# Patient Record
Sex: Female | Born: 1955 | Race: White | Hispanic: No | Marital: Married | Smoking: Former smoker
Health system: Southern US, Community
[De-identification: ages and names within clinical notes are randomized; demographics above are authoritative.]

## PROBLEM LIST (undated history)

## (undated) DIAGNOSIS — C55 Malignant neoplasm of uterus, part unspecified: Secondary | ICD-10-CM

## (undated) DIAGNOSIS — R32 Unspecified urinary incontinence: Secondary | ICD-10-CM

## (undated) DIAGNOSIS — Z1371 Encounter for nonprocreative screening for genetic disease carrier status: Secondary | ICD-10-CM

## (undated) DIAGNOSIS — R5383 Other fatigue: Secondary | ICD-10-CM

## (undated) DIAGNOSIS — I8390 Asymptomatic varicose veins of unspecified lower extremity: Secondary | ICD-10-CM

## (undated) DIAGNOSIS — C569 Malignant neoplasm of unspecified ovary: Secondary | ICD-10-CM

## (undated) DIAGNOSIS — C801 Malignant (primary) neoplasm, unspecified: Secondary | ICD-10-CM

## (undated) HISTORY — DX: Encounter for nonprocreative screening for genetic disease carrier status: Z13.71

---

## 1976-12-21 HISTORY — PX: OVARIAN CYST REMOVAL: SHX89

## 2004-11-26 ENCOUNTER — Ambulatory Visit: Payer: Self-pay

## 2005-11-24 ENCOUNTER — Ambulatory Visit: Payer: Self-pay

## 2006-11-24 ENCOUNTER — Ambulatory Visit: Payer: Self-pay

## 2007-11-30 ENCOUNTER — Ambulatory Visit: Payer: Self-pay

## 2008-10-18 ENCOUNTER — Ambulatory Visit: Payer: Self-pay | Admitting: Internal Medicine

## 2009-03-06 ENCOUNTER — Ambulatory Visit: Payer: Self-pay

## 2010-05-12 ENCOUNTER — Ambulatory Visit: Payer: Self-pay | Admitting: Family Medicine

## 2012-07-05 ENCOUNTER — Ambulatory Visit: Payer: Self-pay

## 2013-08-16 ENCOUNTER — Ambulatory Visit: Payer: Self-pay

## 2014-02-21 LAB — BASIC METABOLIC PANEL
BUN: 14 mg/dL (ref 4–21)
CREATININE: 0.8 mg/dL (ref <=1.1)

## 2014-02-21 LAB — LIPID PANEL
Cholesterol: 167 mg/dL (ref 0–200)
HDL: 61 mg/dL (ref 35–70)
LDL CALC: 87 mg/dL
Triglycerides: 95 mg/dL (ref 40–160)

## 2014-02-21 LAB — CBC AND DIFFERENTIAL: Hemoglobin: 13.9 g/dL (ref 12.0–16.0)

## 2014-04-16 ENCOUNTER — Ambulatory Visit: Payer: Self-pay | Admitting: Gastroenterology

## 2014-11-27 LAB — TSH: TSH: 1.7 u[IU]/mL (ref <=5.90)

## 2015-06-11 ENCOUNTER — Other Ambulatory Visit: Payer: Self-pay | Admitting: Internal Medicine

## 2015-06-11 DIAGNOSIS — Z1231 Encounter for screening mammogram for malignant neoplasm of breast: Secondary | ICD-10-CM

## 2015-07-02 ENCOUNTER — Ambulatory Visit
Admission: RE | Admit: 2015-07-02 | Discharge: 2015-07-02 | Disposition: A | Discharge status: Home / Self Care | Payer: No Typology Code available for payment source | Source: Ambulatory Visit | Attending: Internal Medicine | Admitting: Internal Medicine

## 2015-07-02 DIAGNOSIS — Z1231 Encounter for screening mammogram for malignant neoplasm of breast: Secondary | ICD-10-CM | POA: Insufficient documentation

## 2015-07-02 HISTORY — DX: Malignant (primary) neoplasm, unspecified: C80.1

## 2015-09-16 ENCOUNTER — Encounter: Payer: Self-pay | Admitting: Internal Medicine

## 2015-09-16 DIAGNOSIS — Z780 Asymptomatic menopausal state: Secondary | ICD-10-CM | POA: Insufficient documentation

## 2015-09-16 DIAGNOSIS — N393 Stress incontinence (female) (male): Secondary | ICD-10-CM | POA: Insufficient documentation

## 2015-09-16 DIAGNOSIS — R5383 Other fatigue: Secondary | ICD-10-CM | POA: Insufficient documentation

## 2015-09-16 DIAGNOSIS — I8390 Asymptomatic varicose veins of unspecified lower extremity: Secondary | ICD-10-CM | POA: Insufficient documentation

## 2015-09-24 ENCOUNTER — Ambulatory Visit (INDEPENDENT_AMBULATORY_CARE_PROVIDER_SITE_OTHER): Payer: No Typology Code available for payment source | Admitting: Internal Medicine

## 2015-09-24 ENCOUNTER — Encounter: Payer: Self-pay | Admitting: Internal Medicine

## 2015-09-24 VITALS — BP 132/72 | HR 84 | Ht 65.5 in | Wt 221.2 lb

## 2015-09-24 DIAGNOSIS — Z0000 Encounter for general adult medical examination without abnormal findings: Secondary | ICD-10-CM

## 2015-09-24 DIAGNOSIS — Z23 Encounter for immunization: Secondary | ICD-10-CM

## 2015-09-24 DIAGNOSIS — Z124 Encounter for screening for malignant neoplasm of cervix: Secondary | ICD-10-CM | POA: Diagnosis not present

## 2015-09-24 LAB — POCT URINALYSIS DIPSTICK
BILIRUBIN UA: NEGATIVE
Blood, UA: NEGATIVE
GLUCOSE UA: NEGATIVE
Ketones, UA: NEGATIVE
Leukocytes, UA: NEGATIVE
NITRITE UA: NEGATIVE
Protein, UA: NEGATIVE
Spec Grav, UA: 1.01
Urobilinogen, UA: 0.2
pH, UA: 5

## 2015-09-24 NOTE — Patient Instructions (Signed)

## 2015-09-24 NOTE — Progress Notes (Signed)
Date:  09/24/2015   Name:  Audrey Hart   DOB:  1956/06/07   MRN:  [id number]   Chief Complaint: Annual Exam Audrey Hart is a 59 y.o. female who presents today for her Complete Annual Exam. She feels well. She reports exercising regularly. She reports she is sleeping well. Mammogram was done recently. No breast complaints. Last Pap 2013 - with no pelvic complaints. Colonoscopy is up-to-date done 03/21/2014. She declines a flu vaccine today.  She does agree to a Tdap booster.  Review of Systems:  Review of Systems  Constitutional: Negative for fever, diaphoresis and fatigue.  HENT: Negative for hearing loss.   Eyes: Positive for visual disturbance.  Respiratory: Negative for chest tightness, shortness of breath and wheezing.   Cardiovascular: Negative for chest pain, palpitations and leg swelling.  Gastrointestinal: Negative for abdominal pain, constipation and blood in stool.  Endocrine: Negative for polyphagia and polyuria.  Genitourinary: Negative for urgency, hematuria, vaginal discharge and pelvic pain.  Musculoskeletal: Negative for myalgias, joint swelling and gait problem.  Skin: Negative for color change and rash.  Neurological: Negative for tremors, weakness and headaches.  Hematological: Negative for adenopathy.  Psychiatric/Behavioral: Negative for sleep disturbance and dysphoric mood.    Patient Active Problem List   Diagnosis Date Noted  . Menopause 09/16/2015  . Fatigue 09/16/2015  . Stress incontinence 09/16/2015  . 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 Oophorectomy  1980    Social History  Substance Use Topics  . Smoking status: Former Research scientist (life sciences)  . Smokeless tobacco: None  . Alcohol Use: 1.2 oz/week    2 Standard drinks or equivalent per week     Medication list has been reviewed and updated.  Physical Examination:  Physical Exam   Constitutional: She is oriented to person, place, and time. She appears well-developed and well-nourished. No distress.  HENT:  Head: Normocephalic and atraumatic.  Right Ear: Tympanic membrane and ear canal normal.  Left Ear: Tympanic membrane and ear canal normal.  Nose: Right sinus exhibits no maxillary sinus tenderness. Left sinus exhibits no maxillary sinus tenderness.  Mouth/Throat: Uvula is midline and oropharynx is clear and moist.  Eyes: Conjunctivae and EOM are normal. Right eye exhibits no discharge. Left eye exhibits no discharge. No scleral icterus.  Neck: Normal range of motion. Carotid bruit is not present. No erythema present. No thyromegaly present.  Cardiovascular: Normal rate, regular rhythm, normal heart sounds and normal pulses.   Pulmonary/Chest: Effort normal. No respiratory distress. She has no wheezes. Right breast exhibits no mass, no nipple discharge, no skin change and no tenderness. Left breast exhibits no mass, no nipple discharge, no skin change and no tenderness.  Abdominal: Soft. Bowel sounds are normal. There is no hepatosplenomegaly. There is no tenderness. There is no CVA tenderness.  Genitourinary: Vagina normal and uterus normal. There is no rash or lesion on the right labia. There is no rash or lesion on the left labia. Cervix exhibits no motion tenderness and no discharge. Right adnexum displays no mass, no tenderness and no fullness. Left adnexum displays no mass, no tenderness and no fullness. No vaginal discharge found.  Musculoskeletal: Normal range of motion.       Right knee: Normal.       Left knee: Normal.  Lymphadenopathy:    She has no cervical adenopathy.    She has  no axillary adenopathy.  Neurological: She is alert and oriented to person, place, and time. She has normal reflexes. No cranial nerve deficit or sensory deficit.  Skin: Skin is warm, dry and intact. No rash noted.  Psychiatric: She has a normal mood and affect. Her speech is  normal and behavior is normal. Thought content normal.  Nursing note and vitals reviewed.   BP 132/72 mmHg  Pulse 84  Ht 5' 5.5" (1.664 m)  Wt 221 lb 3.2 oz (100.336 kg)  BMI 36.24 kg/m2  Assessment and Plan: 1. Annual physical exam Recent mammogram was normal Colonoscopy is up-to-date Patient declines flu vaccine - POCT urinalysis dipstick - CBC with Differential/Platelet - Comprehensive metabolic panel - TSH  2. Need for tetanus booster - Tdap vaccine greater than or equal to 7yo IM  3. Cervical cancer screening - Pap IG and HPV (high risk) DNA detection Recommend Pap smear every 3-5 years   Audrey Maidens, MD [facility]  09/24/2015

## 2015-09-25 LAB — CBC WITH DIFFERENTIAL/PLATELET
BASOS: 1 %
Basophils Absolute: 0 10*3/uL (ref 0.0–0.2)
EOS (ABSOLUTE): 0.2 10*3/uL (ref 0.0–0.4)
EOS: 2 %
HEMATOCRIT: 42.3 % (ref 34.0–46.6)
Hemoglobin: 14.3 g/dL (ref 11.1–15.9)
Immature Grans (Abs): 0 10*3/uL (ref 0.0–0.1)
Immature Granulocytes: 1 %
LYMPHS ABS: 2.8 10*3/uL (ref 0.7–3.1)
Lymphs: 42 %
MCH: 30.4 pg (ref 26.6–33.0)
MCHC: 33.8 g/dL (ref 31.5–35.7)
MCV: 90 fL (ref 79–97)
MONOS ABS: 0.5 10*3/uL (ref 0.1–0.9)
Monocytes: 8 %
NEUTROS ABS: 3.1 10*3/uL (ref 1.4–7.0)
NEUTROS PCT: 46 %
PLATELETS: 341 10*3/uL (ref 150–379)
RBC: 4.7 x10E6/uL (ref 3.77–5.28)
RDW: 13.8 % (ref 12.3–15.4)
WBC: 6.6 10*3/uL (ref 3.4–10.8)

## 2015-09-25 LAB — COMPREHENSIVE METABOLIC PANEL
ALT: 24 IU/L (ref 0–32)
AST: 18 IU/L (ref 0–40)
Albumin/Globulin Ratio: 1.6 (ref 1.1–2.5)
Albumin: 4.1 g/dL (ref 3.5–5.5)
Alkaline Phosphatase: 86 IU/L (ref 39–117)
BILIRUBIN TOTAL: 0.7 mg/dL (ref 0.0–1.2)
BUN/Creatinine Ratio: 15 (ref 9–23)
BUN: 13 mg/dL (ref 6–24)
CO2: 28 mmol/L (ref 18–29)
Calcium: 9.4 mg/dL (ref 8.7–10.2)
Chloride: 98 mmol/L (ref 97–108)
Creatinine, Ser: 0.84 mg/dL (ref 0.57–1.00)
GFR calc Af Amer: 88 mL/min/{1.73_m2} (ref >59)
GFR calc non Af Amer: 76 mL/min/{1.73_m2} (ref >59)
Globulin, Total: 2.5 g/dL (ref 1.5–4.5)
Glucose: 87 mg/dL (ref 65–99)
POTASSIUM: 4.7 mmol/L (ref 3.5–5.2)
Sodium: 142 mmol/L (ref 134–144)
Total Protein: 6.6 g/dL (ref 6.0–8.5)

## 2015-09-25 LAB — TSH: TSH: 1.6 u[IU]/mL (ref 0.450–4.500)

## 2015-09-27 LAB — PAP IG AND HPV HIGH-RISK: PAP Smear Comment: 0

## 2016-03-26 ENCOUNTER — Ambulatory Visit (INDEPENDENT_AMBULATORY_CARE_PROVIDER_SITE_OTHER): Payer: BLUE CROSS/BLUE SHIELD | Admitting: Internal Medicine

## 2016-03-26 ENCOUNTER — Encounter: Payer: Self-pay | Admitting: Internal Medicine

## 2016-03-26 VITALS — BP 148/86 | HR 100 | Temp 99.6°F | Ht 65.5 in | Wt 228.0 lb

## 2016-03-26 DIAGNOSIS — J111 Influenza due to unidentified influenza virus with other respiratory manifestations: Secondary | ICD-10-CM

## 2016-03-26 MED ORDER — OSELTAMIVIR PHOSPHATE 75 MG PO CAPS: 75.0000 mg | ORAL_CAPSULE | Freq: Two times a day (BID) | ORAL | Status: DC

## 2016-03-26 MED ORDER — GUAIFENESIN-CODEINE 100-10 MG/5ML PO SYRP: 5.0000 mL | ORAL_SOLUTION | Freq: Three times a day (TID) | ORAL | Status: DC | PRN

## 2016-03-26 NOTE — Progress Notes (Signed)
    Date:  03/26/2016   Name:  Audrey Hart   DOB:  02/12/1956   MRN:  [id number]   Chief Complaint: Fever Fever  This is a new problem. The current episode started yesterday. The problem occurs constantly. The problem has been unchanged. The maximum temperature noted was 100 to 100.9 F. The temperature was taken using an oral thermometer. Associated symptoms include chest pain, congestion, coughing, headaches and muscle aches. Pertinent negatives include no diarrhea, nausea, vomiting or wheezing. She has tried nothing for the symptoms.      Review of Systems  Constitutional: Positive for fever. Negative for chills.  HENT: Positive for congestion.   Respiratory: Positive for cough. Negative for chest tightness and wheezing.   Cardiovascular: Positive for chest pain.  Gastrointestinal: Negative for nausea, vomiting and diarrhea.  Musculoskeletal: Positive for neck pain.  Neurological: Positive for light-headedness and headaches. Negative for dizziness and syncope.    Patient Active Problem List   Diagnosis Date Noted  . Menopause 09/16/2015  . Fatigue 09/16/2015  . Stress incontinence 09/16/2015  . 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 Oophorectomy  1980    Social History  Substance Use Topics  . Smoking status: Former Research scientist (life sciences)  . Smokeless tobacco: None  . Alcohol Use: 1.2 oz/week    2 Standard drinks or equivalent per week     Comment: occasional     Medication list has been reviewed and updated.   Physical Exam  Constitutional: She appears well-developed and well-nourished. She appears listless. She has a sickly appearance.  Neck: Normal range of motion. Neck supple. Muscular tenderness present.  Cardiovascular: Normal rate, regular rhythm and normal heart sounds.   Pulmonary/Chest: Effort normal and breath sounds normal. No respiratory distress. She  has no wheezes.  Lymphadenopathy:    She has no cervical adenopathy.  Neurological: She appears listless.  Psychiatric: She has a normal mood and affect.  Nursing note and vitals reviewed.   BP 148/86 mmHg  Pulse 100  Temp(Src) 99.6 F (37.6 C) (Oral)  Ht 5' 5.5" (1.664 m)  Wt 228 lb (103.42 kg)  BMI 37.35 kg/m2  SpO2 93%  Assessment and Plan: 1. Influenza Push fluids; tylenol every 6-8 hours - oseltamivir (TAMIFLU) 75 MG capsule; Take 1 capsule (75 mg total) by mouth 2 (two) times daily.  Dispense: 10 capsule; Refill: 0 - guaiFENesin-codeine (ROBITUSSIN AC) 100-10 MG/5ML syrup; Take 5 mLs by mouth 3 (three) times daily as needed for cough.  Dispense: 473 mL; Refill: 0   Audrey Maidens, MD [facility]  03/26/2016

## 2016-03-26 NOTE — Patient Instructions (Signed)

## 2016-09-29 ENCOUNTER — Encounter: Payer: No Typology Code available for payment source | Admitting: Internal Medicine

## 2016-11-20 DIAGNOSIS — C55 Malignant neoplasm of uterus, part unspecified: Secondary | ICD-10-CM

## 2016-11-20 HISTORY — DX: Malignant neoplasm of uterus, part unspecified: C55

## 2016-11-30 ENCOUNTER — Ambulatory Visit
Admission: EM | Admit: 2016-11-30 | Discharge: 2016-11-30 | Disposition: A | Discharge status: Home / Self Care | Payer: BLUE CROSS/BLUE SHIELD | Attending: Family Medicine | Admitting: Family Medicine

## 2016-11-30 DIAGNOSIS — N950 Postmenopausal bleeding: Secondary | ICD-10-CM

## 2016-11-30 LAB — URINALYSIS, COMPLETE (UACMP) WITH MICROSCOPIC
BILIRUBIN URINE: NEGATIVE
Bacteria, UA: NONE SEEN
GLUCOSE, UA: NEGATIVE mg/dL
LEUKOCYTES UA: NEGATIVE
NITRITE: NEGATIVE
PH: 6 (ref 5.0–8.0)
SPECIFIC GRAVITY, URINE: 1.025 (ref 1.005–1.030)
SQUAMOUS EPITHELIAL / LPF: NONE SEEN
WBC, UA: NONE SEEN WBC/hpf (ref 0–5)

## 2016-11-30 LAB — CBC WITH DIFFERENTIAL/PLATELET
Basophils Absolute: 0.1 10*3/uL (ref 0–0.1)
Basophils Relative: 1 %
EOS ABS: 0.1 10*3/uL (ref 0–0.7)
Eosinophils Relative: 1 %
HEMATOCRIT: 42.9 % (ref 35.0–47.0)
HEMOGLOBIN: 14.4 g/dL (ref 12.0–16.0)
LYMPHS ABS: 2.6 10*3/uL (ref 1.0–3.6)
Lymphocytes Relative: 35 %
MCH: 30.5 pg (ref 26.0–34.0)
MCHC: 33.4 g/dL (ref 32.0–36.0)
MCV: 91.3 fL (ref 80.0–100.0)
Monocytes Absolute: 0.5 10*3/uL (ref 0.2–0.9)
Monocytes Relative: 6 %
NEUTROS PCT: 57 %
Neutro Abs: 4.2 10*3/uL (ref 1.4–6.5)
Platelets: 303 10*3/uL (ref 150–440)
RBC: 4.7 MIL/uL (ref 3.80–5.20)
RDW: 14.2 % (ref 11.5–14.5)
WBC: 7.4 10*3/uL (ref 3.6–11.0)

## 2016-11-30 LAB — BASIC METABOLIC PANEL
Anion gap: 5 (ref 5–15)
BUN: 15 mg/dL (ref 6–20)
CHLORIDE: 103 mmol/L (ref 101–111)
CO2: 30 mmol/L (ref 22–32)
CREATININE: 0.83 mg/dL (ref 0.44–1.00)
Calcium: 8.6 mg/dL — ABNORMAL LOW (ref 8.9–10.3)
GFR calc Af Amer: >60 mL/min (ref >60)
GFR calc non Af Amer: >60 mL/min (ref >60)
Glucose, Bld: 104 mg/dL — ABNORMAL HIGH (ref 65–99)
Potassium: 3.6 mmol/L (ref 3.5–5.1)
SODIUM: 138 mmol/L (ref 135–145)

## 2016-11-30 LAB — WET PREP, GENITAL
Clue Cells Wet Prep HPF POC: NONE SEEN
SPERM: NONE SEEN
Trich, Wet Prep: NONE SEEN
Yeast Wet Prep HPF POC: NONE SEEN

## 2016-11-30 NOTE — ED Notes (Signed)
Appt made with Dr Ouida Sills tomorrow at 3:00pm

## 2016-11-30 NOTE — Discharge Instructions (Signed)
Rest. Drink plenty of fluids.   Follow up closely with OBGYN.  Follow up with your primary care physician this week as needed. Return to Urgent care for new or worsening concerns.

## 2016-11-30 NOTE — ED Provider Notes (Signed)
[facility] ____________________________________________  Time seen: Approximately 6:15 PM  I have reviewed the triage vital signs and the nursing notes.   HISTORY  Chief Complaint Vaginal Bleeding   HPI Audrey Hart is a 60 y.o. female presents for evaluation of vaginal bleeding. Patient reports that this past Friday and Saturday she had vaginal bleeding, described as dark red blood that saturated 3 panty liners each day. Patient reports that she had a similar more brief episode 1 month ago. Patient reports that she is 15 years postmenopausal. Patient denies any other vaginal bleeding. Denies any vaginal bleeding today. Patient states some suprapubic pressure sensation, but denies pain. Denies urinary frequency, urinary urgency or burning with urination. Denies other abdominal pain or back pain. Denies vaginal discharge, vaginal itching or vaginal irritation. Patient reports sexually active with her one partner husband, denies recent sexual partner changes. Last sexual intercourse for 2 weeks ago. Patient denies any direct correlation with vaginal bleeding presentation and timing of sexual intercourse. Denies any trauma. Patient reports that she did fall one day last week because she tripped but states she was able to catch herself on her knees and her hands and denies any abdominal or pelvic trauma.   Patient denies any history of hormonal use. Denies any other vaginal complaints. Denies smoking. Denies blood in urine. Denies melena, hematochezia, abnormal bruising or abnormal bleeding. Denies family history of breast uterine or ovarian cancer. Patient reports she has had one ovarian cyst in the past that was removed. Denies any other abdominal surgery. Denies fevers, chest pain, shortness of breath. Reports continues to eat and drink well with normal activity levels. Patient states unable to be evaluated by a new patient OB/GYN until the end of January.   Past Medical History:   Diagnosis Date  . Cancer [facility])    skin ca    Patient Active Problem List   Diagnosis Date Noted  . Menopause 09/16/2015  . Fatigue 09/16/2015  . Stress incontinence 09/16/2015  . 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 OOPHORECTOMY  1980      No current facility-administered medications for this encounter.  No current outpatient prescriptions on file.  Allergies Shrimp [shellfish allergy]   family history. Denies family history of cancer.  Brothers: CAD, MI Mother: "liver issues"  Social History Social History  Substance Use Topics  . Smoking status: Former Research scientist (life sciences)  . Smokeless tobacco: Never Used  . Alcohol use 1.2 oz/week    2 Standard drinks or equivalent per week     Comment: occasional    Review of Systems Constitutional: No fever/chills Eyes: No visual changes. ENT: No sore throat. Cardiovascular: Denies chest pain. Respiratory: Denies shortness of breath. Gastrointestinal: No abdominal pain.  No nausea, no vomiting.  No diarrhea.  No constipation. Genitourinary: Negative for dysuria. As above.  Musculoskeletal: Negative for back pain. Skin: Negative for rash. Neurological: Negative for headaches, focal weakness or numbness.  10-point ROS otherwise negative.  ____________________________________________   PHYSICAL EXAM:  VITAL SIGNS: ED Triage Vitals  Enc Vitals Group     BP 11/30/16 1411 140/66     Pulse Rate 11/30/16 1411 78     Resp 11/30/16 1411 18     Temp 11/30/16 1411 98 F (36.7 C)     Temp Source 11/30/16 1411 Oral     SpO2 11/30/16 1411 98 %     Weight 11/30/16 1409 220 lb (99.8 kg)     Height  11/30/16 1409 5\' 5"  (1.651 m)     Head Circumference --      Peak Flow --      Pain Score 11/30/16 1644 0     Pain Loc --      Pain Edu? --      Excl. in [place]? --     Constitutional: Alert and oriented. Well appearing and in no acute distress. Eyes: Conjunctivae are normal. PERRL. EOMI. ENT       Head: Normocephalic and atraumatic.      Mouth/Throat: Mucous membranes are moist. Cardiovascular: Normal rate, regular rhythm. Grossly normal heart sounds.  Good peripheral circulation. Respiratory: Normal respiratory effort without tachypnea nor retractions. Breath sounds are clear and equal bilaterally. No wheezes/rales/rhonchi.. Gastrointestinal: Soft and nontender. Obese abdomen. Normal Bowel sounds. No CVA tenderness. Pelvic: Exam completed with Lauri RN at bedside as chaperone. External: Normal appearance no rash or lesions. Speculum: No active bleeding, no dried blood, no foreign body, no canal laceration or irritation visualized, mild irritation noted at central cervix. Bimanual: nontender. No cervical or adnexal tenderness. Musculoskeletal:  Ambulatory with steady gait. No midline cervical, thoracic or lumbar tenderness to palpation.  Neurologic:  Normal speech and language. No gross focal neurologic deficits are appreciated. Speech is normal. No gait instability.  Skin:  Skin is warm, dry and intact. No rash noted. Psychiatric: Mood and affect are normal. Speech and behavior are normal. Patient exhibits appropriate insight and judgment   ___________________________________________   LABS (all labs ordered are listed, but only abnormal results are displayed)  Labs Reviewed  WET PREP, GENITAL - Abnormal; Notable for the following:       Result Value   WBC, Wet Prep HPF POC FEW (*)    All other components within normal limits  URINALYSIS, COMPLETE (UACMP) WITH MICROSCOPIC - Abnormal; Notable for the following:    Hgb urine dipstick SMALL (*)    Ketones, ur TRACE (*)    Protein, ur TRACE (*)    All other components within normal limits  BASIC METABOLIC PANEL - Abnormal; Notable for the following:    Glucose, Bld 104 (*)    Calcium 8.6 (*)    All other components within normal limits  CBC WITH DIFFERENTIAL/PLATELET     PROCEDURES Procedures   INITIAL IMPRESSION /  ASSESSMENT AND PLAN / ED COURSE  Pertinent labs & imaging results that were available during my care of the patient were reviewed by me and considered in my medical decision making (see chart for details).  Very well-appearing patient. No acute distress. 15 years postmenopausal vaginal bleeding. No active vaginal bleeding at this time. Patient hemodynamically stable. Discussed in detail with patient need for further evaluation for diagnosis as well as as it exclusion of endometrial cancer. Discussed in detail with patient need to follow-up with OB/GYN. RN called and was able to obtain appointment with Dr. Ouida Sills for tomorrow at 3 PM. Patient agrees with this plan.   Discussed follow up with Primary care physician this week. Discussed follow up and return parameters including no resolution or any worsening concerns. Patient verbalized understanding and agreed to plan.   ____________________________________________   FINAL CLINICAL IMPRESSION(S) / ED DIAGNOSES  Final diagnoses:  Postmenopausal vaginal bleeding     There are no discharge medications for this patient.   Note: This dictation was prepared with Dragon dictation along with smaller phrase technology. Any transcriptional errors that result from this process are unintentional.    Clinical Course  Marylene Land, NP 11/30/16 [place], NP 11/30/16 1827

## 2016-11-30 NOTE — ED Triage Notes (Signed)
Pt is postmenopausal for 15 years and about 4 months ago she had a little spotting, and then Friday and Saturday she bleed to were she saturated a pad each day. She says something just feels werid. The blood is dark in color and no smell. She hasnt had intercourse lately. She did have a fall Tuesday however she isnt sure if that's related or not. She fell in our parking lot.

## 2016-12-21 DIAGNOSIS — C569 Malignant neoplasm of unspecified ovary: Secondary | ICD-10-CM

## 2016-12-21 HISTORY — DX: Malignant neoplasm of unspecified ovary: C56.9

## 2016-12-30 ENCOUNTER — Encounter (INDEPENDENT_AMBULATORY_CARE_PROVIDER_SITE_OTHER): Payer: Self-pay

## 2016-12-30 ENCOUNTER — Inpatient Hospital Stay: Payer: BLUE CROSS/BLUE SHIELD | Attending: Obstetrics and Gynecology | Admitting: Obstetrics and Gynecology

## 2016-12-30 VITALS — BP 148/88 | HR 73 | Temp 97.7°F | Wt 228.6 lb

## 2016-12-30 DIAGNOSIS — E669 Obesity, unspecified: Secondary | ICD-10-CM | POA: Diagnosis not present

## 2016-12-30 DIAGNOSIS — Z780 Asymptomatic menopausal state: Secondary | ICD-10-CM | POA: Diagnosis not present

## 2016-12-30 DIAGNOSIS — C541 Malignant neoplasm of endometrium: Secondary | ICD-10-CM | POA: Insufficient documentation

## 2016-12-30 DIAGNOSIS — N393 Stress incontinence (female) (male): Secondary | ICD-10-CM

## 2016-12-30 DIAGNOSIS — Z87891 Personal history of nicotine dependence: Secondary | ICD-10-CM

## 2016-12-30 DIAGNOSIS — Z6838 Body mass index (BMI) 38.0-38.9, adult: Secondary | ICD-10-CM | POA: Diagnosis not present

## 2016-12-30 NOTE — Patient Instructions (Addendum)
Laparoscopic Hysterectomy, Care After Refer to this sheet in the next few weeks. These instructions provide you with information on caring for yourself after your procedure. Your health care provider may also give you more specific instructions. Your treatment has been planned according to current medical practices, but problems sometimes occur. Call your health care provider if you have any problems or questions after your procedure. What can I expect after the procedure?  Pain and bruising at the incision sites. You will be given pain medicine to control it.  Menopausal symptoms such as hot flashes, night sweats, and insomnia if your ovaries were removed.  Sore throat from the breathing tube that was inserted during surgery. Follow these instructions at home:  Only take over-the-counter or prescription medicines for pain, discomfort, or fever as directed by your health care provider.  Do not take aspirin. It can cause bleeding.  Do not drive when taking pain medicine.  Follow your health care provider's advice regarding diet, exercise, lifting, driving, and general activities.  Resume your usual diet as directed and allowed.  Get plenty of rest and sleep.  Do not douche, use tampons, or have sexual intercourse for at least 6 weeks, or until your health care provider gives you permission.  Change your bandages (dressings) as directed by your health care provider.  Monitor your temperature and notify your health care provider of a fever.  Take showers instead of baths for 2-3 weeks.  Do not drink alcohol until your health care provider gives you permission.  If you develop constipation, you may take a mild laxative with your health care provider's permission. Bran foods may help with constipation problems. Drinking enough fluids to keep your urine clear or pale yellow may help as well.  Try to have someone home with you for 1-2 weeks to help around the house.  Keep all of your  follow-up appointments as directed by your health care provider. Contact a health care provider if:  You have swelling, redness, or increasing pain around your incision sites.  You have pus coming from your incision.  You notice a bad smell coming from your incision.  Your incision breaks open.  You feel dizzy or lightheaded.  You have pain or bleeding when you urinate.  You have persistent diarrhea.  You have persistent nausea and vomiting.  You have abnormal vaginal discharge.  You have a rash.  You have any type of abnormal reaction or develop an allergy to your medicine.  You have poor pain control with your prescribed medicine. Get help right away if:  You have chest pain or shortness of breath.  You have severe abdominal pain that is not relieved with pain medicine.  You have pain or swelling in your legs. This information is not intended to replace advice given to you by your health care provider. Make sure you discuss any questions you have with your health care provider. Document Released: 09/27/2013 Document Revised: 05/14/2016 Document Reviewed: 06/27/2013 Elsevier Interactive Patient Education  2017 [employer]       Clear Liquid Diet for GYN Oncology Patients Day Before Surgery The day before your scheduled surgery DO NOT EAT any solid foods.  We do want you to drink enough liquids, but NO MILK products.  We do not want you to be dehydrated.  Clear liquids are defined as no milk products and no pieces of any solid food. The following are all approved for you to drink the day before you surgery.  Chicken, Beef   or Vegetable Broth (bouillon or consomm) - NO BROTH AFTER MIDNIGHT  Plain Jello  (no fruit)  Water  Strained lemonade or fruit punch  Gatorade (any flavor)  CLEAR Ensure or Boost Breeze  Fruit juices without pulp, such as apple, grape, or cranberry juice  Clear sodas - NO SODA AFTER MIDNIGHT  Ice Pops without bits of fruit or fruit  pulp  Honey  Tea or coffee without milk or cream Any foods not on the above list should be avoided.                                                                               DIVISION OF GYNECOLOGIC ONCOLOGY BOWEL PREP   The following instructions are extremely important to prepare for your surgery. Please follow them carefully   Step 1: Liquid Diet Instructions   The day before surgery, drink ONLY CLEAR LIQUIDS for breakfast, lunch, dinner and throughout the day.  Drink at least 64 oz of fluid.             CLEAR LIQUID EXAMPLES:             Beef, chicken or vegetable broth, sodas, coffee, tea (sugar, lemon             artificial sweeteners, honey are acceptable), juices (apple, grape, cranberry, any    mixture of clear juices). Kool-Aid, Gatorade, Jell-o (without fruit), popsicles                          NO MILK, MILK PRODUCTS, NON-DAIRY CREAMERS    Step 2: Laxatives           The evening before surgery:   Time: around 5pm   Follow these instructions carefully.   Administer 1 Dulcolax suppository according to manufacturer instructions on the box. You will need to purchase this laxative at a pharmacy or grocery store.     Individual responses to laxatives vary; this prep may cause multiple bowel movements. It often works in 30 minutes and may take as long as 3 hours. Stay near an available bathroom.    It is important to stay hydrated. Ensure you are still drinking clear liquids.      IMPORTANT: FOR YOUR SAFETY, WE WILL HAVE TO CANCEL YOUR SURGERY IF YOU DO NOT FOLLOW THESE INSTRUCTIONS.    Do not eat anything after midnight (including gum or candy) prior to your surgery.  Avoid drinking carbonated beverages after midnight.  You can have clear liquids up until one hour before you arrive at the hospital. "Nothing by mouth" means no liquids, gum, candy, etc for one hour before your arrival time.                                              Bowel Symptoms After  Surgery After gynecologic surgery, women often have temporary changes in bowel function (constipation and gas pain).  Following are tips to help prevent and treat common bowel problems.  It also tells you when to call the doctor.  This is   important because some symptoms might be a sign of a more serious bowel problem such as obstruction (bowel blockage).  These problems are rare but can happen after gynecologic surgery.   Besides surgery, what can temporarily affect bowel function? 1. Dietary changes   2. Decreased physical activity   3.Antibiotics   4. Pain medication   How can I prevent constipation (three days or more without a stool)? 1. Include fiber in your diet: whole grains, raw or dried fruits & vegetables, prunes, prune/pear juiceDrink at least 8 glasses of liquid (preferably water) every day 2. Avoid: ? Gas forming foods such as broccoli, beans, peas, salads, cabbage, sweet potatoes ? Greasy, fatty, or fried foods 3. Activity helps bowel function return to normal, walk around the house at least 3-4 times each day for 15 minutes or longer, if tolerated.  Rocking in a rocking chair is preferable to sitting still. 4. Stool softeners: these are not laxatives, but serve to soften the stool to avoid straining.  Take 2-4 times a day until normal bowel function returns         Examples: Colace or generic equivalent (Docusafe) 5. Bulk laxatives: provide a concentrated source of fiber.  They do not stimulate the bowel.  Take 1-2 times each day until normal bowel function return.              Examples: Citrucel, Metamucil, Fiberal, Fibercon   What can I take for "Gas Pains"? 1. Simethicone (Mylicon, Gas-X, Maalox-Gas, Mylanta-Gas) take 3-4 times a day 2. Maalox Regular - take 3-4 times a day 3. Mylanta Regular - take 3-4 times a day   What can I take if I become constipated? 1. Start with stool softeners and add additional laxatives below as needed to have a bowel movement every 1-2 days   2. Stool softeners 1-2 tablets, 2 times a day 3. Senakot 1-2 tablets, 1-2 times a day 4. Glycerin suppository can soften hard stool take once a day 5. Bisacodyl suppository once a day  6. Milk of Magnesia 30 mL 1-2 times a day 7. Fleets or tap water enema    What can I do for nausea?  1. Limit most solid foods for 24-48 hours 2. Continue eating small frequent amounts of liquids and/or bland soft foods ? Toast, crackers, cooked cereal (grits, cream of wheat, rice) 3. Benadryl: a mild anti-nausea medicine can be obtained without a prescription. May cause drowsiness, especially if taken with narcotic pain medicines 4. Contact provider for prescription nausea medication     What can I do, or take for diarrhea (more than five loose stools per day)? 1. Drink plenty of clear fluids to prevent dehydration 2. May take Kaopectate, Pepto-Bismol, Immodium, or probiotics for 1-2 days 3. Annusol or Preparation-H can be helpful for hemorrhoids and irritated tissue around anus   When should I call the doctor?             CONSTIPATION:   Not relieved after three days following the above program VOMITING:  That contains blood, "coffee ground" material  More the three times/hour and unable to keep down nausea medication for more than eight hours  With dry mouth, dark or strong urine, feeling light-headed, dizzy, or confused  With severe abdominal pain or bloating for more than 24 hours DIARRHEA:  That continues for more then 24-48 hours despite treatment  That contains blood or tarry material  With dry mouth, dark or strong urine, feeling light~headed, dizzy, or confused FEVER:  101   F or higher along with nausea, vomiting, gas pain, diarrhea UNABLE TO:  Pass gas from rectum for more than 24 hours  Tolerate liquids by mouth for more than 24 hours              Laparoscopy Laparoscopy is a procedure to diagnose diseases in the abdomen. During the procedure, a thin, lighted,  pencil-sized instrument called a laparoscope is inserted into the abdomen through an incision. The laparoscope allows your health care provider to look at the organs inside your body. LET YOUR HEALTH CARE PROVIDER KNOW ABOUT:  Any allergies you have.  All medicines you are taking, including vitamins, herbs, eye drops, creams, and over-the-counter medicines.  Previous problems you or members of your family have had with the use of anesthetics.  Any blood disorders you have.  Previous surgeries you have had.  Medical conditions you have. RISKS AND COMPLICATIONS  Generally, this is a safe procedure. However, problems can occur, which may include:  Infection.  Bleeding.  Damage to other organs.  Allergic reaction to the anesthetics used during the procedure. BEFORE THE PROCEDURE  Do not eat or drink anything after midnight on the night before the procedure or as directed by your health care provider.  Ask your health care provider about: ? Changing or stopping your regular medicines. ? Taking medicines such as aspirin and ibuprofen. These medicines can thin your blood. Do not take these medicines before your procedure if your health care provider instructs you not to.  Plan to have someone take you home after the procedure. PROCEDURE  You may be given a medicine to help you relax (sedative).  You will be given a medicine to make you sleep (general anesthetic).  Your abdomen will be inflated with a gas. This will make your organs easier to see.  Small incisions will be made in your abdomen.  A laparoscope and other small instruments will be inserted into the abdomen through the incisions.  A tissue sample may be removed from an organ in the abdomen for examination.  The instruments will be removed from the abdomen.  The gas will be released.  The incisions will be closed with stitches (sutures). AFTER THE PROCEDURE  Your blood pressure, heart rate, breathing rate, and  blood oxygen level will be monitored often until the medicines you were given have worn off.   This information is not intended to replace advice given to you by your health care provider. Make sure you discuss any questions you have with your health care provider.   c.    Laparoscopy, Care After Refer to this sheet in the next few weeks. These instructions provide you with information about caring for yourself after your procedure. Your health care provider may also give you more specific instructions. Your treatment has been planned according to current medical practices, but problems sometimes occur. Call your health care provider if you have any problems or questions after your procedure. WHAT TO EXPECT AFTER THE PROCEDURE After your procedure, it is common to have mild discomfort in the throat and abdomen. HOME CARE INSTRUCTIONS  Take over-the-counter and prescription medicines only as told by your health care provider.  Do not drive for 24 hours if you received a sedative.  Return to your normal activities as told by your health care provider.  Do not take baths, swim, or use a hot tub until your health care provider approves. You may shower.  Follow instructions from your health care provider about how   to take care of your incision. Make sure you: ? Wash your hands with soap and water before you change your bandage (dressing). If soap and water are not available, use hand sanitizer. ? Change your dressing as told by your health care provider. ? Leave stitches (sutures), skin glue, or adhesive strips in place. These skin closures may need to stay in place for 2 weeks or longer. If adhesive strip edges start to loosen and curl up, you may trim the loose edges. Do not remove adhesive strips completely unless your health care provider tells you to do that.  Check your incision area every day for signs of infection. Check for: ? More redness, swelling, or pain. ? More fluid or  blood. ? Warmth. ? Pus or a bad smell.  It is your responsibility to get the results of your procedure. Ask your health care provider or the department performing the procedure when your results will be ready. SEEK MEDICAL CARE IF:  There is new pain in your shoulders.  You feel light-headed or faint.  You are unable to pass gas or unable to have a bowel movement.  You feel nauseous or you vomit.  You develop a rash.  You have more redness, swelling, or pain around your incision.  You have more fluid or blood coming from your incision.  Your incision feels warm to the touch.  You have pus or a bad smell coming from your incision.  You have a fever or chills. SEEK IMMEDIATE MEDICAL CARE IF:  Your pain is getting worse.  You have ongoing vomiting.  The edges of your incision open up.  You have trouble breathing.  You have chest pain.   This information is not intended to replace advice given to you by your health care provider. Make sure you discuss any questions you have with your health care provider.                   Uterine Cancer Uterine cancer is an abnormal growth of tissue (tumor) in the uterus that is cancerous (malignant). Unlike noncancerous (benign) tumors, malignant tumors can spread to other parts of your body. The wall of the uterus has two layers of tissue. The inner layer is the endometrium. The outer layer of muscle tissue is the myometrium. The most common type of uterine cancer begins in the endometrium. This is called endometrial cancer. Cancer that begins in the myometrium is called uterine sarcoma, which is very rare. What increases the risk? Although the exact cause of uterine cancer is unknown, there are a number of risk factors that can increase your chances of getting uterine cancer. They include:  Your age. Uterine cancer occurs mostly in women older than 50 years.  Having an enlarged endometrium (endometrial hyperplasia).  Using  hormone therapy.  Obesity.  Taking the drug tamoxifen.  White race.  Infertility.  Never being pregnant.  Beginning menstrual periods at an age younger than 12 years.  Having menstrual periods at an age older than 42 years.  Personal history of ovarian, intestinal, or colorectal cancer.  Having a family history of uterine cancer.  Having a family history of hereditary nonpolyposis colon cancer (HNPCC).  Having diabetes, high blood pressure, thyroid disease, or gallbladder disease.  Long-term use of high-dose birth control pills.  Exposure to radiation.  Smoking. What are the signs or symptoms?  Abnormal vaginal bleeding or discharge. Bleeding may start as a watery, blood-streaked flow that gradually contains more blood.  Any  vaginal bleeding after menopause.  Difficult or painful urination.  Pain during intercourse.  Pain in the pelvic area.  Mass in the vagina.  Pain or fullness in the abdomen.  Frequent urination.  Bleeding between periods.  Growth of the stomach.  Unexplained weight loss. Uterine cancer usually occurs after menopause. However, it may also occur around the time that menopause begins. Abnormal vaginal bleeding is the most common symptom of uterine cancer. Women should not assume that abnormal vaginal bleeding is part of menopause. How is this diagnosed? Your health care provider will ask about your medical history. He or she may also perform a number of procedures, such as:  A physical and pelvic exam. Your health care provider will feel your pelvis for any lumps.  Blood and urine tests.  X-rays.  Imaging tests, such as CT scans, ultrasonography, or MRIs.  A hysteroscopy to view the inside of your uterus.  A Pap test to sample cells from the cervix and upper vagina to check for abnormal cells.  Taking a tissue sample (biopsy) from the uterine lining to look for cancer cells.  A dilation and curettage (D&C). This involves  stretching (dilation) the cervix and scraping (curettage) the inside lining of the uterus to get a tissue sample. The sample is examined under a microscope to look for cancer cells. Your cancer will be staged to determine its severity and extent. Staging is a careful attempt to find out the size of the tumor, whether the cancer has spread, and if so, to what parts of the body. You may need to have more tests to determine the stage of your cancer. The test results will help determine what treatment plan is best for you. Cancer stages include:  Stage I. The cancer is only found in the uterus.  Stage II. The cancer has spread to the cervix.  Stage III. The cancer has spread outside the uterus, but not outside the pelvis. The cancer may have spread to the lymph nodes in the pelvis.  Stage IV. The cancer has spread to other parts of the body, such as the bladder or rectum. How is this treated? Most women with uterine cancer are treated with surgery. This includes removing the uterus, cervix, fallopian tubes, and ovaries (total hysterectomy). Your lymph nodes near the tumor may also be removed. Some women have radiation, chemotherapy, or hormonal therapy. Other women have a combination of these therapies. Follow these instructions at home:  Take medicines only as directed by your health care provider.  Maintain a healthy diet.  Exercise regularly.  If you have diabetes, high blood pressure, thyroid disease, or gallbladder disease, follow your health care provider's instructions to keep it under control.  Do not smoke.  Consider joining a support group. This may help you learn to cope with the stress of having uterine cancer.  Seek advice to help you manage treatment side effects.  Keep all follow-up visits as directed by your health care provider. Contact a health care provider if:  You have increased stomach or pelvic pain.  You cannot urinate.  You have abnormal bleeding. This  information is not intended to replace advice given to you by your health care provider. Make sure you discuss any questions you have with your health care provider. Document Released: 12/07/2005 Document Revised: 05/14/2016 Document Reviewed: 05/26/2013 Elsevier Interactive Patient Education  2017 [employer].

## 2016-12-30 NOTE — Progress Notes (Signed)
Gynecologic Oncology Consult Visit   Referring Provider: Dr. Ouida Sills  Chief Concern: grade 1 endometrial cancer  Subjective:  Audrey Hart is a 61 y.o. female s/p oophorectomy for ovarian cysts in the 1978 who is seen in consultation from Dr. Ouida Sills for grade 1 endometrial cancer. She was referred to Dr. Ouida Sills by Paula Compton, NP for  postmenopausal bleeding.   She presented with postmenopausal bleeding for 3-4 months.   EMBx 12/01/2016 revealed well differentiated endometrial cancer. Uterus sounded to 8 cm.   She presents today for definitive management. She complains of pelvic pain that has been ongoing prior to her endometrial biopsy.   Routine health care screening:  Mammogram 2016 Colonoscopy 2015   Problem List: Patient Active Problem List   Diagnosis Date Noted  . Endometrial cancer ([place]) 12/30/2016  . Menopause 09/16/2015  . Fatigue 09/16/2015  . Stress incontinence 09/16/2015  . 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 Cancer ([place])    skin ca    Past Surgical History: Past Surgical History:  Procedure Laterality Date  . OVARIAN CYST REMOVAL  1978    Past Gynecologic History:  Menarche: 12 Last Menstrual Period: Menopause 40's History of Abnormal pap: no, no abnormalities Last pap: 11/2016 [facility].     OB History:  OB History  Gravida Para Term Preterm AB Living  1 1          SAB TAB Ectopic Multiple Live Births               # Outcome Date GA Lbr Len/2nd Weight Sex Delivery Anes PTL Lv  1 Para               Family History: Family History  Problem Relation Age of Onset  . Heart disease Brother   . Clotting disorder Brother 6    blood clots associated with inactivity    Social History: Social History   Social History  . Marital status: Married    Spouse name: N/A  . Number of children: N/A  . Years of education: N/A   Occupational History  . Massage therapy     Social History Main Topics  . Smoking status: Former Research scientist (life sciences)  . Smokeless tobacco: Never Used  . Alcohol use 1.2 oz/week    2 Standard drinks or equivalent per week     Comment: occasional  . Drug use: No  . Sexual activity: Not on file   Other Topics Concern  . Not on file   Social History Narrative  . No narrative on file    Allergies: Allergies  Allergen Reactions  . Shrimp [Shellfish Allergy] Hives and Nausea And Vomiting  . Cantaloupe (Diagnostic) Hives    Current Medications: Current Outpatient Prescriptions  Medication Sig Dispense Refill  . acyclovir (ZOVIRAX) 400 MG tablet   0  . nystatin (MYCOSTATIN/NYSTOP) powder   1   No current facility-administered medications for this visit.     Review of Systems General: negative Skin: negative for changes in color, texture, moles or lesions Eyes: negative for, changes in vision HEENT: negative for, change in hearing, voice changes Pulmonary: negative for, dyspnea, productive cough Cardiac: negative for, palpitations, pain Gastrointestinal: negative for, nausea, vomiting, constipation, diarrhea, hematemesis, hematochezia Genitourinary/Sexual: negative for, dysuria, hesitancy, incontinence, blood Ob/Gyn: irregular bleeding and pelvic pain associated with the uterus Musculoskeletal: negative for, pain Hematology: negative for, easy bruising Neurologic/Psych: negative for, headaches, seizures, paralysis, weakness  Objective:  Physical  Examination:  BP (!) 148/88 (BP Location: Left Arm) Comment: re check  Pulse 73   Temp 97.7 F (36.5 C) (Tympanic)   Wt 228 lb 9.9 oz (103.7 kg)   BMI 38.04 kg/m    ECOG Performance Status: 0 - Asymptomatic  General appearance: alert, cooperative and appears stated age HEENT:PERRLA, extra ocular movement intact and sclera clear, anicteric Lymph node survey: non-palpable, axillary, inguinal, supraclavicular Cardiovascular: regular rate and rhythm Respiratory: normal air  entry, lungs clear to auscultation Abdomen: soft, non-tender, without masses or organomegaly, no hernias and well healed incision Back: inspection of back is normal Extremities: extremities normal, atraumatic, no cyanosis or edema Skin exam - normal coloration and turgor, no rashes, no suspicious skin lesions noted. Neurological exam reveals alert, oriented, normal speech, no focal findings or movement disorder noted.  Pelvic: exam chaperoned by nurse;  Vulva: normal appearing vulva with no masses, tenderness or lesions; Vagina: normal vagina; Adnexa: no masses; Uterus: uterus is normal size, shape, consistency and slightly tender to palpation, parametria smooth; Cervix: no lesions; Rectal: not indicated    Lab Review Labs on site today: Pending  Radiologic Imaging: CXR ordered    Assessment:  UNKOWN Hart is a 61 y.o. female diagnosed with grade 1  endometrial cancer. She desires definitive surgical treatment.   Pelvic pain of uncertain etiology, concerning for deep myometrial invasion. She had the pain prior to the biopsy so I do not think it is endometritis.   Medical co-morbidities complicating care: obesity.  Plan:   Problem List Items Addressed This Visit      Genitourinary   Endometrial cancer ([place]) - Primary   Relevant Medications   acyclovir (ZOVIRAX) 400 MG tablet      We discussed options for management including endometrial cancer, grade 1. Based on exam, we recommend definitive surgical evaluation.  Risks were discussed in detail. These include infection, anesthesia, bleeding, transfusion, wound separation, vaginal cuff dehiscence, medical issues (blood clots, stroke, heart attack, fluid in the lungs, pneumonia, abnormal heart rhythm, death), possible exploratory surgery with larger incision, lymphedema, lymphocyst, allergic reaction, injury to adjacent organs (bowel, bladder, blood vessels, nerves, ureters, uterus)   VTE and antibiotic prophylaxis ordered.  Given pelvic pain I have also ordered a CT scan of the abdomen/pelvis. We will order a non-contrast study.  She is allergic to shellfish so we will use methylene blue for the SLN injection.   Suggested return to clinic in  4-6 weeks after surgery.    The patient's diagnosis, an outline of the further diagnostic and laboratory studies which will be required, the recommendation, and alternatives were discussed.  All questions were answered to the patient's satisfaction.  A total of 60 minutes were spent with the patient/family today; 50% was spent in education, counseling and coordination of care for endometrial cancer.    Gillis Ends, MD    CC:  Dr. Ouida Sills   PCPs:  Paula Compton, NP   and  Glean Hess, MD [address] [place], [place] [id number] [phone]

## 2016-12-30 NOTE — Progress Notes (Signed)
  Oncology Nurse Navigator Documentation Chaperoned pelvic exam. Pre and post op teaching completed. No further questions at this time. Provided contact information for further needs. Navigator Location: [facility] (12/30/16 1200)   )Navigator Encounter Type: Initial GynOnc (12/30/16 1200)                     Patient Visit Type: GynOnc (12/30/16 1200)   Barriers/Navigation Needs: Coordination of Care;Education (12/30/16 1200) Education:  (surgery) (12/30/16 1200) Interventions: Coordination of Care (12/30/16 1200)            Acuity: Level 2 (12/30/16 1200)         Time Spent with Patient: 30 (12/30/16 1200)

## 2016-12-30 NOTE — Progress Notes (Signed)
Patient here for initial visit. BP elevated patient states it is usually normal and that she is stressed today.

## 2016-12-31 NOTE — Progress Notes (Signed)
  Oncology Nurse Navigator Documentation Surgery has been scheduled for 1/17 at 0730 with Dr. Cristopher Estimable. Preadmit phone interview scheduled from 9a-1p. Sent to financial for authorization. Notified Audrey Hart about phone interview. She has my contact information for any future questions or needs. Navigator Location: [facility] (12/31/16 1500)   )Navigator Encounter Type:  (surgery) (12/31/16 1500)       Surgery Date: 01/06/17 (12/31/16 1500)                 Barriers/Navigation Needs: Coordination of Care (12/31/16 1500) Education: Preparing for Upcoming Surgery/ Treatment (12/31/16 1500) Interventions: Coordination of Care (12/31/16 1500)   Coordination of Care:  (surgery) (12/31/16 1500)                  Time Spent with Patient: > 120 (12/31/16 1500)

## 2017-01-01 ENCOUNTER — Other Ambulatory Visit: Payer: Self-pay

## 2017-01-01 ENCOUNTER — Ambulatory Visit
Admission: RE | Admit: 2017-01-01 | Discharge: 2017-01-01 | Disposition: A | Discharge status: Home / Self Care | Payer: BLUE CROSS/BLUE SHIELD | Source: Ambulatory Visit | Attending: Obstetrics and Gynecology | Admitting: Obstetrics and Gynecology

## 2017-01-01 DIAGNOSIS — M47895 Other spondylosis, thoracolumbar region: Secondary | ICD-10-CM | POA: Insufficient documentation

## 2017-01-01 DIAGNOSIS — C541 Malignant neoplasm of endometrium: Secondary | ICD-10-CM | POA: Insufficient documentation

## 2017-01-01 DIAGNOSIS — K8020 Calculus of gallbladder without cholecystitis without obstruction: Secondary | ICD-10-CM | POA: Diagnosis not present

## 2017-01-04 ENCOUNTER — Encounter
Admission: RE | Admit: 2017-01-04 | Discharge: 2017-01-04 | Disposition: A | Discharge status: Home / Self Care | Payer: BLUE CROSS/BLUE SHIELD | Source: Ambulatory Visit | Attending: Obstetrics and Gynecology | Admitting: Obstetrics and Gynecology

## 2017-01-04 ENCOUNTER — Ambulatory Visit
Admission: RE | Admit: 2017-01-04 | Discharge: 2017-01-04 | Disposition: A | Discharge status: Home / Self Care | Payer: BLUE CROSS/BLUE SHIELD | Source: Ambulatory Visit | Attending: Obstetrics and Gynecology | Admitting: Obstetrics and Gynecology

## 2017-01-04 ENCOUNTER — Inpatient Hospital Stay: Admission: RE | Admit: 2017-01-04 | Payer: BLUE CROSS/BLUE SHIELD | Source: Ambulatory Visit

## 2017-01-04 DIAGNOSIS — N888 Other specified noninflammatory disorders of cervix uteri: Secondary | ICD-10-CM | POA: Diagnosis not present

## 2017-01-04 DIAGNOSIS — Z87891 Personal history of nicotine dependence: Secondary | ICD-10-CM | POA: Diagnosis not present

## 2017-01-04 DIAGNOSIS — C541 Malignant neoplasm of endometrium: Secondary | ICD-10-CM | POA: Insufficient documentation

## 2017-01-04 DIAGNOSIS — N72 Inflammatory disease of cervix uteri: Secondary | ICD-10-CM | POA: Diagnosis not present

## 2017-01-04 DIAGNOSIS — E669 Obesity, unspecified: Secondary | ICD-10-CM | POA: Diagnosis not present

## 2017-01-04 DIAGNOSIS — Z6838 Body mass index (BMI) 38.0-38.9, adult: Secondary | ICD-10-CM | POA: Diagnosis not present

## 2017-01-04 DIAGNOSIS — N83292 Other ovarian cyst, left side: Secondary | ICD-10-CM | POA: Diagnosis not present

## 2017-01-04 DIAGNOSIS — C561 Malignant neoplasm of right ovary: Secondary | ICD-10-CM | POA: Diagnosis not present

## 2017-01-04 DIAGNOSIS — N950 Postmenopausal bleeding: Secondary | ICD-10-CM | POA: Diagnosis not present

## 2017-01-04 HISTORY — DX: Asymptomatic varicose veins of unspecified lower extremity: I83.90

## 2017-01-04 HISTORY — DX: Unspecified urinary incontinence: R32

## 2017-01-04 HISTORY — DX: Other fatigue: R53.83

## 2017-01-04 LAB — COMPREHENSIVE METABOLIC PANEL
ALBUMIN: 3.9 g/dL (ref 3.5–5.0)
ALK PHOS: 64 U/L (ref 38–126)
ALT: 15 U/L (ref 14–54)
AST: 28 U/L (ref 15–41)
Anion gap: 8 (ref 5–15)
BUN: 14 mg/dL (ref 6–20)
CALCIUM: 9 mg/dL (ref 8.9–10.3)
CHLORIDE: 103 mmol/L (ref 101–111)
CO2: 29 mmol/L (ref 22–32)
CREATININE: 0.86 mg/dL (ref 0.44–1.00)
GFR calc non Af Amer: >60 mL/min (ref >60)
GLUCOSE: 109 mg/dL — AB (ref 65–99)
Potassium: 3.4 mmol/L — ABNORMAL LOW (ref 3.5–5.1)
SODIUM: 140 mmol/L (ref 135–145)
Total Bilirubin: 0.6 mg/dL (ref 0.3–1.2)
Total Protein: 6.9 g/dL (ref 6.5–8.1)

## 2017-01-04 LAB — CBC
HCT: 40.7 % (ref 35.0–47.0)
HEMOGLOBIN: 13.6 g/dL (ref 12.0–16.0)
MCH: 30.2 pg (ref 26.0–34.0)
MCHC: 33.4 g/dL (ref 32.0–36.0)
MCV: 90.5 fL (ref 80.0–100.0)
PLATELETS: 291 10*3/uL (ref 150–440)
RBC: 4.5 MIL/uL (ref 3.80–5.20)
RDW: 14.2 % (ref 11.5–14.5)
WBC: 7.8 10*3/uL (ref 3.6–11.0)

## 2017-01-04 LAB — TYPE AND SCREEN
ABO/RH(D): A POS
ANTIBODY SCREEN: NEGATIVE

## 2017-01-04 NOTE — Patient Instructions (Signed)
  Your procedure is scheduled on: 01/06/17 Report to Day Surgery.[facility] SECOND FLOOR To find out your arrival time please call [phone] between 1PM - 3PM on 01/05/17  Remember: Instructions that are not followed completely may result in serious medical risk, up to and including death, or upon the discretion of your surgeon and anesthesiologist your surgery may need to be rescheduled.    _X___ 1. Do not eat food or drink liquids after midnight. No gum chewing or hard candies.     ____ 2. No Alcohol for 24 hours before or after surgery.   ____ 3. Do Not Smoke For 24 Hours Prior to Your Surgery.   ____ 4. Bring all medications with you on the day of surgery if instructed.    __X__ 5. Notify your doctor if there is any change in your medical condition     (cold, fever, infections).       Do not wear jewelry, make-up, hairpins, clips or nail polish.  Do not wear lotions, powders, or perfumes. You may wear deodorant.  Do not shave 48 hours prior to surgery. Men may shave face and neck.  Do not bring valuables to the hospital.    [facility] is not responsible for any belongings or valuables.               Contacts, dentures or bridgework may not be worn into surgery.  Leave your suitcase in the car. After surgery it may be brought to your room.  For patients admitted to the hospital, discharge time is determined by your                treatment team.   Patients discharged the day of surgery will not be allowed to drive home.    ____ Take these medicines the morning of surgery with A SIP OF WATER:    1. NONE  2.   3.   4.  5.  6.  ____ Fleet Enema (as directed)   _X_ Use CHG Soap as directed  ____ Use inhalers on the day of surgery  ____ Stop metformin 2 days prior to surgery    ____ Take 1/2 of usual insulin dose the night before surgery and none on the morning of surgery.   ____ Stop Coumadin/Plavix/aspirin on  _X__ Stop Anti-inflammatories on    NOW UNTIL  AFTER SURGERY  __X__ Stop supplements until after surgery.    STOP PROBIOTIC UNTIL AFTER SURGERY  ____ Bring C-Pap to the hospital.

## 2017-01-04 NOTE — Pre-Procedure Instructions (Signed)
Potassium results sent to Dr. Theora Gianotti and Anesthesia for review.

## 2017-01-05 ENCOUNTER — Telehealth: Payer: Self-pay

## 2017-01-05 NOTE — Telephone Encounter (Signed)
  Oncology Nurse Navigator Documentation Verified with [name] that she will arrive for her surgery in the am Navigator Location: [facility] (01/05/17 1600)   )Navigator Encounter Type: Telephone (01/05/17 1600)                                                    Time Spent with Patient: 15 (01/05/17 1600)

## 2017-01-06 ENCOUNTER — Encounter
Admission: RE | Disposition: A | Discharge status: Home / Self Care | Payer: Self-pay | Source: Ambulatory Visit | Attending: Obstetrics & Gynecology

## 2017-01-06 ENCOUNTER — Encounter: Payer: Self-pay | Admitting: *Deleted

## 2017-01-06 ENCOUNTER — Ambulatory Visit: Payer: BLUE CROSS/BLUE SHIELD | Admitting: Anesthesiology

## 2017-01-06 ENCOUNTER — Observation Stay
Admission: RE | Admit: 2017-01-06 | Discharge: 2017-01-07 | Disposition: A | Discharge status: Home / Self Care | Payer: BLUE CROSS/BLUE SHIELD | Source: Ambulatory Visit | Attending: Obstetrics & Gynecology | Admitting: Obstetrics & Gynecology

## 2017-01-06 DIAGNOSIS — N950 Postmenopausal bleeding: Secondary | ICD-10-CM | POA: Insufficient documentation

## 2017-01-06 DIAGNOSIS — N83292 Other ovarian cyst, left side: Secondary | ICD-10-CM | POA: Insufficient documentation

## 2017-01-06 DIAGNOSIS — Z90710 Acquired absence of both cervix and uterus: Secondary | ICD-10-CM | POA: Diagnosis present

## 2017-01-06 DIAGNOSIS — E669 Obesity, unspecified: Secondary | ICD-10-CM | POA: Insufficient documentation

## 2017-01-06 DIAGNOSIS — C561 Malignant neoplasm of right ovary: Secondary | ICD-10-CM | POA: Insufficient documentation

## 2017-01-06 DIAGNOSIS — C541 Malignant neoplasm of endometrium: Secondary | ICD-10-CM | POA: Diagnosis not present

## 2017-01-06 DIAGNOSIS — N888 Other specified noninflammatory disorders of cervix uteri: Secondary | ICD-10-CM | POA: Insufficient documentation

## 2017-01-06 DIAGNOSIS — Z6838 Body mass index (BMI) 38.0-38.9, adult: Secondary | ICD-10-CM | POA: Insufficient documentation

## 2017-01-06 DIAGNOSIS — N72 Inflammatory disease of cervix uteri: Secondary | ICD-10-CM | POA: Insufficient documentation

## 2017-01-06 DIAGNOSIS — Z87891 Personal history of nicotine dependence: Secondary | ICD-10-CM | POA: Insufficient documentation

## 2017-01-06 HISTORY — PX: ROBOTIC ASSISTED TOTAL HYSTERECTOMY WITH BILATERAL SALPINGO OOPHERECTOMY: SHX6086

## 2017-01-06 HISTORY — PX: ROBOTIC PELVIC AND PARA-AORTIC LYMPH NODE DISSECTION: SHX6210

## 2017-01-06 HISTORY — PX: SENTINEL NODE BIOPSY: SHX6608

## 2017-01-06 HISTORY — PX: ABDOMINAL HYSTERECTOMY: SHX81

## 2017-01-06 LAB — ABO/RH: ABO/RH(D): A POS

## 2017-01-06 SURGERY — ROBOTIC ASSISTED TOTAL HYSTERECTOMY WITH BILATERAL SALPINGO OOPHORECTOMY
Anesthesia: General

## 2017-01-06 MED ORDER — PROPOFOL 10 MG/ML IV BOLUS
INTRAVENOUS | Status: AC
  Filled 2017-01-06: qty 20

## 2017-01-06 MED ORDER — CEFAZOLIN SODIUM-DEXTROSE 2-4 GM/100ML-% IV SOLN
INTRAVENOUS | Status: AC
  Filled 2017-01-06: qty 100

## 2017-01-06 MED ORDER — DOCUSATE SODIUM 100 MG PO CAPS
100.0000 mg | ORAL_CAPSULE | Freq: Two times a day (BID) | ORAL | Status: DC
  Administered 2017-01-06 – 2017-01-07 (×3): 100 mg via ORAL
  Filled 2017-01-06 (×3): qty 1

## 2017-01-06 MED ORDER — KETOROLAC TROMETHAMINE 30 MG/ML IJ SOLN
INTRAMUSCULAR | Status: DC | PRN
  Administered 2017-01-06: 30 mg via INTRAVENOUS

## 2017-01-06 MED ORDER — BUPIVACAINE HCL (PF) 0.5 % IJ SOLN
INTRAMUSCULAR | Status: AC
  Filled 2017-01-06: qty 30

## 2017-01-06 MED ORDER — METHYLENE BLUE 0.5 % INJ SOLN
INTRAVENOUS | Status: DC | PRN
  Administered 2017-01-06: 4 mL via SUBMUCOSAL

## 2017-01-06 MED ORDER — MIDAZOLAM HCL 2 MG/2ML IJ SOLN
INTRAMUSCULAR | Status: DC | PRN
  Administered 2017-01-06: 2 mg via INTRAVENOUS

## 2017-01-06 MED ORDER — ROCURONIUM BROMIDE 100 MG/10ML IV SOLN
INTRAVENOUS | Status: DC | PRN
  Administered 2017-01-06: 20 mg via INTRAVENOUS
  Administered 2017-01-06 (×2): 10 mg via INTRAVENOUS
  Administered 2017-01-06: 20 mg via INTRAVENOUS
  Administered 2017-01-06: 30 mg via INTRAVENOUS
  Administered 2017-01-06: 10 mg via INTRAVENOUS

## 2017-01-06 MED ORDER — HYDROMORPHONE HCL 1 MG/ML IJ SOLN
INTRAMUSCULAR | Status: AC
  Administered 2017-01-06: 0.5 mg via INTRAVENOUS
  Filled 2017-01-06: qty 1

## 2017-01-06 MED ORDER — SUGAMMADEX SODIUM 200 MG/2ML IV SOLN
INTRAVENOUS | Status: AC
  Filled 2017-01-06: qty 2

## 2017-01-06 MED ORDER — BUPIVACAINE HCL 0.5 % IJ SOLN
INTRAMUSCULAR | Status: DC | PRN
  Administered 2017-01-06: 30 mL

## 2017-01-06 MED ORDER — ONDANSETRON HCL 4 MG/2ML IJ SOLN
INTRAMUSCULAR | Status: DC | PRN
  Administered 2017-01-06: 4 mg via INTRAVENOUS

## 2017-01-06 MED ORDER — DEXAMETHASONE SODIUM PHOSPHATE 10 MG/ML IJ SOLN
INTRAMUSCULAR | Status: AC
  Filled 2017-01-06: qty 1

## 2017-01-06 MED ORDER — ACETAMINOPHEN 325 MG PO TABS
650.0000 mg | ORAL_TABLET | ORAL | Status: DC
  Administered 2017-01-06 – 2017-01-07 (×4): 650 mg via ORAL
  Filled 2017-01-06 (×4): qty 2

## 2017-01-06 MED ORDER — FENTANYL CITRATE (PF) 100 MCG/2ML IJ SOLN
INTRAMUSCULAR | Status: AC
  Administered 2017-01-06: 25 ug via INTRAVENOUS
  Filled 2017-01-06: qty 2

## 2017-01-06 MED ORDER — TRAMADOL HCL 50 MG PO TABS: 50.0000 mg | ORAL_TABLET | Freq: Four times a day (QID) | ORAL | Status: DC | PRN

## 2017-01-06 MED ORDER — LIDOCAINE HCL (PF) 2 % IJ SOLN
INTRAMUSCULAR | Status: AC
  Filled 2017-01-06: qty 2

## 2017-01-06 MED ORDER — SODIUM CHLORIDE 0.9 % IV SOLN
50.0000 mL/h | INTRAVENOUS | Status: DC
  Administered 2017-01-06: 50 mL/h via INTRAVENOUS

## 2017-01-06 MED ORDER — FENTANYL CITRATE (PF) 100 MCG/2ML IJ SOLN
INTRAMUSCULAR | Status: DC | PRN
  Administered 2017-01-06: 100 ug via INTRAVENOUS

## 2017-01-06 MED ORDER — MIDAZOLAM HCL 2 MG/2ML IJ SOLN
INTRAMUSCULAR | Status: AC
  Filled 2017-01-06: qty 2

## 2017-01-06 MED ORDER — EPHEDRINE 5 MG/ML INJ
INTRAVENOUS | Status: AC
  Filled 2017-01-06: qty 10

## 2017-01-06 MED ORDER — HYDROMORPHONE HCL 1 MG/ML IJ SOLN
0.2000 mg | INTRAMUSCULAR | Status: DC | PRN
  Administered 2017-01-06 (×2): 0.6 mg via INTRAVENOUS
  Filled 2017-01-06 (×2): qty 1

## 2017-01-06 MED ORDER — METHYLENE BLUE 0.5 % INJ SOLN
INTRAVENOUS | Status: AC
Start: 2017-01-06 — End: 2017-01-06
  Filled 2017-01-06: qty 10

## 2017-01-06 MED ORDER — MENTHOL 3 MG MT LOZG: 1.0000 | LOZENGE | OROMUCOSAL | Status: DC | PRN

## 2017-01-06 MED ORDER — HEPARIN SODIUM (PORCINE) 5000 UNIT/ML IJ SOLN
5000.0000 [IU] | INTRAMUSCULAR | Status: AC
  Administered 2017-01-06: 5000 [IU] via SUBCUTANEOUS

## 2017-01-06 MED ORDER — FAMOTIDINE 20 MG PO TABS
ORAL_TABLET | ORAL | Status: AC
  Administered 2017-01-06: 20 mg via ORAL
  Filled 2017-01-06: qty 1

## 2017-01-06 MED ORDER — PROPOFOL 10 MG/ML IV BOLUS
INTRAVENOUS | Status: DC | PRN
Start: 2017-01-06 — End: 2017-01-06
  Administered 2017-01-06: 120 mg via INTRAVENOUS

## 2017-01-06 MED ORDER — SUGAMMADEX SODIUM 200 MG/2ML IV SOLN
INTRAVENOUS | Status: DC | PRN
  Administered 2017-01-06: 200 mg via INTRAVENOUS
  Administered 2017-01-06: 50 mg via INTRAVENOUS

## 2017-01-06 MED ORDER — HYDROMORPHONE HCL 1 MG/ML IJ SOLN
0.2500 mg | INTRAMUSCULAR | Status: DC | PRN
  Administered 2017-01-06 (×2): 0.5 mg via INTRAVENOUS

## 2017-01-06 MED ORDER — ONDANSETRON HCL 4 MG/2ML IJ SOLN: 4.0000 mg | Freq: Once | INTRAMUSCULAR | Status: DC | PRN

## 2017-01-06 MED ORDER — ONDANSETRON HCL 4 MG/2ML IJ SOLN
INTRAMUSCULAR | Status: AC
  Filled 2017-01-06: qty 2

## 2017-01-06 MED ORDER — AMMONIA AROMATIC IN INHA
RESPIRATORY_TRACT | Status: AC
  Filled 2017-01-06: qty 10

## 2017-01-06 MED ORDER — ONDANSETRON HCL 4 MG/2ML IJ SOLN: 4.0000 mg | Freq: Four times a day (QID) | INTRAMUSCULAR | Status: DC | PRN

## 2017-01-06 MED ORDER — ONDANSETRON HCL 4 MG PO TABS: 4.0000 mg | ORAL_TABLET | Freq: Four times a day (QID) | ORAL | Status: DC | PRN

## 2017-01-06 MED ORDER — FENTANYL CITRATE (PF) 100 MCG/2ML IJ SOLN
INTRAMUSCULAR | Status: AC
  Filled 2017-01-06: qty 2

## 2017-01-06 MED ORDER — DEXAMETHASONE SODIUM PHOSPHATE 10 MG/ML IJ SOLN
INTRAMUSCULAR | Status: DC | PRN
  Administered 2017-01-06: 10 mg via INTRAVENOUS

## 2017-01-06 MED ORDER — LACTATED RINGERS IV SOLN
INTRAVENOUS | Status: DC
  Administered 2017-01-06: 07:00:00 via INTRAVENOUS

## 2017-01-06 MED ORDER — LIDOCAINE HCL (CARDIAC) 20 MG/ML IV SOLN
INTRAVENOUS | Status: DC | PRN
  Administered 2017-01-06: 60 mg via INTRAVENOUS

## 2017-01-06 MED ORDER — KETOROLAC TROMETHAMINE 30 MG/ML IJ SOLN: 30.0000 mg | Freq: Once | INTRAMUSCULAR | Status: DC

## 2017-01-06 MED ORDER — SIMETHICONE 80 MG PO CHEW: 160.0000 mg | CHEWABLE_TABLET | Freq: Four times a day (QID) | ORAL | Status: DC | PRN

## 2017-01-06 MED ORDER — CEFAZOLIN SODIUM-DEXTROSE 2-4 GM/100ML-% IV SOLN
2.0000 g | INTRAVENOUS | Status: AC
  Administered 2017-01-06: 2 g via INTRAVENOUS

## 2017-01-06 MED ORDER — HEPARIN SODIUM (PORCINE) 5000 UNIT/ML IJ SOLN
INTRAMUSCULAR | Status: AC
  Administered 2017-01-06: 5000 [IU] via SUBCUTANEOUS
  Filled 2017-01-06: qty 1

## 2017-01-06 MED ORDER — ROCURONIUM BROMIDE 50 MG/5ML IV SOSY
PREFILLED_SYRINGE | INTRAVENOUS | Status: AC
  Filled 2017-01-06: qty 10

## 2017-01-06 MED ORDER — KETOROLAC TROMETHAMINE 30 MG/ML IJ SOLN
INTRAMUSCULAR | Status: AC
  Filled 2017-01-06: qty 1

## 2017-01-06 MED ORDER — EPHEDRINE SULFATE 50 MG/ML IJ SOLN
INTRAMUSCULAR | Status: DC | PRN
  Administered 2017-01-06 (×4): 10 mg via INTRAVENOUS

## 2017-01-06 MED ORDER — IBUPROFEN 600 MG PO TABS
600.0000 mg | ORAL_TABLET | Freq: Four times a day (QID) | ORAL | Status: DC
  Administered 2017-01-06 – 2017-01-07 (×4): 600 mg via ORAL
  Filled 2017-01-06 (×5): qty 1

## 2017-01-06 MED ORDER — FAMOTIDINE 20 MG PO TABS
20.0000 mg | ORAL_TABLET | Freq: Once | ORAL | Status: AC
  Administered 2017-01-06: 20 mg via ORAL

## 2017-01-06 MED ORDER — HYDROMORPHONE HCL 1 MG/ML IJ SOLN
INTRAMUSCULAR | Status: DC | PRN
  Administered 2017-01-06 (×2): .25 mg via INTRAVENOUS

## 2017-01-06 MED ORDER — FENTANYL CITRATE (PF) 100 MCG/2ML IJ SOLN
25.0000 ug | INTRAMUSCULAR | Status: AC | PRN
  Administered 2017-01-06 (×6): 25 ug via INTRAVENOUS

## 2017-01-06 MED ORDER — LACTATED RINGERS IV SOLN
INTRAVENOUS | Status: DC | PRN
  Administered 2017-01-06: 07:00:00 via INTRAVENOUS

## 2017-01-06 MED ORDER — HYDROMORPHONE HCL 1 MG/ML IJ SOLN
INTRAMUSCULAR | Status: AC
  Filled 2017-01-06: qty 1

## 2017-01-06 SURGICAL SUPPLY — 69 items
ANCHOR TIS RET SYS 235ML (MISCELLANEOUS) ×4 IMPLANT
BAG URO DRAIN 2000ML W/SPOUT (MISCELLANEOUS) ×4 IMPLANT
BLADE SURG 11 STRL SS SAFETY (MISCELLANEOUS) ×4 IMPLANT
CANISTER SUCT 1200ML W/VALVE (MISCELLANEOUS) ×4 IMPLANT
CANNULA DILATOR 10 W/SLV (CANNULA) ×6 IMPLANT
CANNULA DILATOR 10MM W/SLV (CANNULA) ×2
CATH FOLEY 2WAY  5CC 16FR (CATHETERS) ×2
CATH URTH 16FR FL 2W BLN LF (CATHETERS) ×2 IMPLANT
CHLORAPREP W/TINT 26ML (MISCELLANEOUS) ×4 IMPLANT
CORD BIP STRL DISP 12FT (MISCELLANEOUS) ×4 IMPLANT
CORD MONOPOLAR M/FML 12FT (MISCELLANEOUS) ×4 IMPLANT
COVER TIP SHEARS 8 DVNC (MISCELLANEOUS) ×2 IMPLANT
COVER TIP SHEARS 8MM DA VINCI (MISCELLANEOUS) ×2
DEFOGGER SCOPE WARMER CLEARIFY (MISCELLANEOUS) ×4 IMPLANT
DRAPE LEGGINS SURG 28X43 STRL (DRAPES) ×4 IMPLANT
DRAPE SHEET LG 3/4 BI-LAMINATE (DRAPES) ×8 IMPLANT
DRESSING SURGICEL FIBRLLR 1X2 (HEMOSTASIS) ×4 IMPLANT
DRSG SURGICEL FIBRILLAR 1X2 (HEMOSTASIS) ×8
ELECT REM PT RETURN 9FT ADLT (ELECTROSURGICAL) ×4
ELECTRODE REM PT RTRN 9FT ADLT (ELECTROSURGICAL) ×2 IMPLANT
FILTER LAP SMOKE EVAC STRL (MISCELLANEOUS) ×4 IMPLANT
GLOVE BIO SURGEON STRL SZ 6.5 (GLOVE) ×6 IMPLANT
GLOVE BIO SURGEONS STRL SZ 6.5 (GLOVE) ×2
GLOVE INDICATOR 7.0 STRL GRN (GLOVE) ×8 IMPLANT
GOWN STRL REUS W/ TWL LRG LVL3 (GOWN DISPOSABLE) ×8 IMPLANT
GOWN STRL REUS W/TWL LRG LVL3 (GOWN DISPOSABLE) ×8
GRASPER SUT TROCAR 14GX15 (MISCELLANEOUS) ×4 IMPLANT
IRRIGATION STRYKERFLOW (MISCELLANEOUS) ×2 IMPLANT
IRRIGATOR STRYKERFLOW (MISCELLANEOUS) ×4
KIT ACCESSORY DA VINCI DISP (KITS) ×2
KIT ACCESSORY DVNC DISP (KITS) ×2 IMPLANT
KIT PINK PAD W/HEAD ARE REST (MISCELLANEOUS) ×4
KIT PINK PAD W/HEAD ARM REST (MISCELLANEOUS) ×2 IMPLANT
KIT RM TURNOVER CYSTO AR (KITS) ×4 IMPLANT
LABEL OR SOLS (LABEL) ×4 IMPLANT
LIQUID BAND (GAUZE/BANDAGES/DRESSINGS) ×4 IMPLANT
MANIPULATOR VCARE LG CRV RETR (MISCELLANEOUS) ×4 IMPLANT
MANIPULATOR VCARE STD CRV RETR (MISCELLANEOUS) ×4 IMPLANT
NDL INSUFF 14G 150MM VS150000 (NEEDLE) ×4 IMPLANT
NDL INSUFF ACCESS 14 VERSASTEP (NEEDLE) ×4 IMPLANT
NDL SAFETY 22GX1.5 (NEEDLE) ×8 IMPLANT
NS IRRIG 1000ML POUR BTL (IV SOLUTION) ×4 IMPLANT
NS IRRIG 500ML POUR BTL (IV SOLUTION) ×4 IMPLANT
OCCLUDER COLPOPNEUMO (BALLOONS) ×8 IMPLANT
PACK GYN LAPAROSCOPIC (MISCELLANEOUS) ×4 IMPLANT
PAD OB MATERNITY 4.3X12.25 (PERSONAL CARE ITEMS) ×4 IMPLANT
PAD PREP 24X41 OB/GYN DISP (PERSONAL CARE ITEMS) ×4 IMPLANT
PENCIL ELECTRO HAND CTR (MISCELLANEOUS) ×4 IMPLANT
RETRACTOR GRASP SM DA VINCI (INSTRUMENTS) ×2
RETRACTOR GRASP SM DVNC (INSTRUMENTS) ×2 IMPLANT
SCISSORS METZENBAUM CVD 33 (INSTRUMENTS) ×4 IMPLANT
SLEEVE VERSASTEP EXPAND ONEST (MISCELLANEOUS) ×20 IMPLANT
SOLUTION ELECTROLUBE (MISCELLANEOUS) ×4 IMPLANT
SUT DVC VLOC 180 0 12IN GS21 (SUTURE) ×8
SUT VIC AB 1 CT1 36 (SUTURE) ×8 IMPLANT
SUT VIC AB 2-0 CT1 27 (SUTURE) ×2
SUT VIC AB 2-0 CT1 TAPERPNT 27 (SUTURE) ×2 IMPLANT
SUT VICRYL 0 AB UR-6 (SUTURE) ×4 IMPLANT
SUTURE DVC VLC 180 0 12IN GS21 (SUTURE) ×4 IMPLANT
SYR 3ML LL SCALE MARK (SYRINGE) ×4 IMPLANT
SYR 50ML LL SCALE MARK (SYRINGE) ×4 IMPLANT
SYR CONTROL 10ML (SYRINGE) ×4 IMPLANT
SYRINGE 10CC LL (SYRINGE) ×8 IMPLANT
TROCAR 130MM GELPORT  DAV (MISCELLANEOUS) ×4 IMPLANT
TROCAR DISP BLADELESS 8 DVNC (TROCAR) ×2 IMPLANT
TROCAR DISP BLADELESS 8MM (TROCAR) ×2
TROCAR VERSASTEP 12M LG PL (TROCAR) ×4 IMPLANT
TROCAR VERSASTEP PLUS 12MM (TROCAR) ×4 IMPLANT
TUBING INSUFFLATOR HEATED (MISCELLANEOUS) ×4 IMPLANT

## 2017-01-06 NOTE — Anesthesia Post-op Follow-up Note (Cosign Needed)
Anesthesia QCDR form completed.        

## 2017-01-06 NOTE — Anesthesia Preprocedure Evaluation (Signed)
Anesthesia Evaluation  Patient identified by MRN, date of birth, ID band Patient awake    Reviewed: Allergy & Precautions, NPO status , Patient's Chart, lab work & pertinent test results, reviewed documented beta blocker date and time   Airway Mallampati: III  TM Distance: >3 FB     Dental  (+) Chipped   Pulmonary former smoker,           Cardiovascular + Peripheral Vascular Disease       Neuro/Psych    GI/Hepatic   Endo/Other    Renal/GU      Musculoskeletal   Abdominal   Peds  Hematology   Anesthesia Other Findings Obese.  Reproductive/Obstetrics                             Anesthesia Physical Anesthesia Plan  ASA: III  Anesthesia Plan: General   Post-op Pain Management:    Induction: Intravenous  Airway Management Planned: Oral ETT  Additional Equipment:   Intra-op Plan:   Post-operative Plan:   Informed Consent: I have reviewed the patients History and Physical, chart, labs and discussed the procedure including the risks, benefits and alternatives for the proposed anesthesia with the patient or authorized representative who has indicated his/her understanding and acceptance.     Plan Discussed with: CRNA  Anesthesia Plan Comments:         Anesthesia Quick Evaluation

## 2017-01-06 NOTE — Op Note (Addendum)
Operative Note   Date 01/06/2017 Time 10:48 AM  PRE-OP DIAGNOSIS: ENDOMETRIAL CANCER, GRADE 1    POST-OP DIAGNOSIS: STAGE 1 ENDOMETRIAL CANCER, GRADE 1  SURGEON: Surgeon(s) and Role: Panel 1:    Dr. Larey Days, MD  Panel 2:    * Rainy Rothman Gaetana Michaelis, MD - Secondary for Sentinel node injection, mapping, and biopsy.   ASSISTANT:  Gillis Ends, MD Assisted Dr. Leonides Schanz with robotic TLH BSO  ANESTHESIA: Choice   PROCEDURE: Procedure(s): Robotic TLH, BSO, Sentinel node injection, mapping, and bilateral pelvic sentinel lymph node biopsy  ESTIMATED BLOOD LOSS: Minimal  DRAINS: Foley  TOTAL IV FLUIDS: 1700 cc  SPECIMENS:  Right primary sentinel external iliac vein node; left primary sentinel external iliac vein node   COMPLICATIONS: None  DISPOSITION: stable to PACU  CONDITION: stable  INDICATIONS: Endometrial cancer  FINDINGS: Exam under anesthesia revealed a small 6-8 week size mobile anteverted uterus. There were no adnexal masses or nodularity. The parametria was smooth. The cervix was negative for gross lesions. Intraoperative findings included a small uterus. The adnexa were normal bilaterally. There was a small ovary on the left which may have been her site of prior ovarian surgery. There was a small 1 mm nodule on the left fallopian tube. The upper abdomen was normal including omentum, bowel, liver, stomach, and diaphragmatic surfaces. There was no evidence of grossly malignant pelvic or right para-aortic lymph nodes. The sentinel node injection was successful with evidence of right primary sentinel  external iliac vein  node; and left primary sentinel  external iliac vein  node.    PROCEDURE IN DETAIL: After informed consent was obtained, the patient was taken to the operating room where anesthesia was obtained without difficulty. The patient was positioned in the dorsal lithotomy position in [place] and her arms were carefully tucked at her sides and  the usual precautions were taken.  She was prepped and draped in normal sterile fashion.  Time-out was performed and a Foley catheter was placed into the bladder.   A supraumbilical incision was made and carried down through the various layers until the peritoneal cavity was entered. The laparoscope was inserted with the above noted findings. Bilateral 8 mm robotic ports, and a 5-12 LUQ port was placed under direct visualization.   The speculum was inserted into the vagina. The cervix was infiltrated with 4 ml of methylene blue at 3 an 9 o'clock both superficial and deep injections. A standard VCare uterine manipulator was then placed in the uterus without incident.    The patient was placed in Trendelenburg and the bowel was displaced up into the upper abdomen.  Cytologic washings were obtained.  Round ligaments were divided on each side with the EndoShears and the retroperitoneal space was opened bilaterally.  The ureters were identified and preserved.  At this point the retroperitoneal spaces were developed and the lymphatic channels were mapped to each side.  The primary sentinel node on the right side was then identified, skeletonized and removed taking care not to injure the  obturator nerve, the ureter or the pelvic vasculature.On the left adhesions involving the peritoneum to the IP ligament were lysed and the tubal nodule and adhesive area were biopsied. Specimens removed from the sterile field. Similarly on the left side, the retroperitoneal spaces were developed, the lymphatic channels mapped to identify the sentinel nodeand they were similarly removed with care to preserve the obturator nerve, the ureter and the pelvic vasculature. With hemostasis secured, the infundibulopelvic ligaments were skeletonized,  sealed and divided with the LigaSure device.  A bladder flap was created and the bladder was dissected down off the lower uterine segment and cervix using endoshears and electrocautery.  The  uterine arteries were skeletonized bilaterally, sealed and divided with the LigaSure device.  A colpotomy was performed circumferentially along the V-Care ring with electrocautery and the cervix was incised from the vagina and the specimen was removed through the vagina.  A pneumo balloon was placed in the vagina and the vaginal cuff was then closed in a running continuous fashion using the EndoStitch technique with 0 V-Lock suture with careful attention to include the vaginal cuff angles and the vaginal mucosa within the closure.  Intraoperative pathologic evaluation revealed favorable risk criteria and therefore further dissection was not performed. Hemostasis was observed. Fibrillar was placed on the vaginal cuff and left SLN site. The intraperitoneal pressure was dropped, and all planes of dissection, vascular pedicles and the vaginal cuff were found to be hemostatic.  The LUQ trocar was removed and the fascia was closed with 0 Vicryl suture using the Endoclose technique. The lateral trocars were removed under visualization.   Before the supraumbilical trocar was removed the CO2 gas was released.  The fascia there was closed with 0 Vicryl suture in running technique.   The skin incision at the umbilicus was closed with subcuticular stitch.  The remaining skin incisions were closed with Indermil glue.  The patient tolerated the procedure well.  Sponge, lap and needle counts were correct x2.  The patient was taken to recovery room in excellent condition.  Antibiotics: Given 1st or 2nd generation cephalosporin, Antibiotics given within 1 hour of the start of the procedure, Antibiotics ordered to be discontinued within 24 hours post procedure   VTE prophylaxis: was ordered perioperatively. SCDs and SQ heparin.    Dr. Leonides Schanz assisted me for the SLN mapping and biopsies. I assisted her with the TLH, BSO portion of the procedure.  Gillis Ends, MD

## 2017-01-06 NOTE — Anesthesia Procedure Notes (Signed)
Procedure Name: Intubation Date/Time: 01/06/2017 7:34 AM Performed by: Darlyne Russian Pre-anesthesia Checklist: Patient identified, Emergency Drugs available, Suction available, Patient being monitored and Timeout performed Patient Re-evaluated:Patient Re-evaluated prior to inductionOxygen Delivery Method: Circle system utilized Preoxygenation: Pre-oxygenation with 100% oxygen Intubation Type: IV induction Ventilation: Mask ventilation without difficulty Laryngoscope Size: Mac and 3 Tube type: Oral Tube size: 7.0 mm Number of attempts: 1 Airway Equipment and Method: Stylet Placement Confirmation: ETT inserted through vocal cords under direct vision,  positive ETCO2 and breath sounds checked- equal and bilateral Secured at: 21 cm Tube secured with: Tape Dental Injury: Teeth and Oropharynx as per pre-operative assessment

## 2017-01-06 NOTE — Transfer of Care (Signed)
Immediate Anesthesia Transfer of Care Note  Patient: Audrey Hart  Procedure(s) Performed: Procedure(s): ROBOTIC ASSISTED TOTAL HYSTERECTOMY WITH BILATERAL SALPINGO OOPHORECTOMY (Bilateral) SENTINEL NODE BIOPSY (N/A) ROBOTIC PELVIC AND PARA-AORTIC LYMPH NODE DISSECTION (N/A)  Patient Location: PACU  Anesthesia Type:General  Level of Consciousness: responds to stimulation  Airway & Oxygen Therapy: Patient Spontanous Breathing and Patient connected to nasal cannula oxygen  Post-op Assessment: Report given to RN and Post -op Vital signs reviewed and stable  Post vital signs: Reviewed and stable  Last Vitals:  Vitals:   01/06/17 0717  BP: 140/79  Pulse: 89  Resp: 16  Temp: 36.4 C    Last Pain:  Vitals:   01/06/17 0717  TempSrc: Oral         Complications: No apparent anesthesia complications

## 2017-01-06 NOTE — H&P (View-Only) (Signed)
Gynecologic Oncology Consult Visit   Referring Provider: Dr. Ouida Sills  Chief Concern: grade 1 endometrial cancer  Subjective:  Audrey Hart is a 61 y.o. female s/p oophorectomy for ovarian cysts in the 1978 who is seen in consultation from Dr. Ouida Sills for grade 1 endometrial cancer. She was referred to Dr. Ouida Sills by Audrey Hart, Audrey Hart for  postmenopausal bleeding.   She presented with postmenopausal bleeding for 3-4 months.   EMBx 12/01/2016 revealed well differentiated endometrial cancer. Uterus sounded to 8 cm.   She presents today for definitive management. She complains of pelvic pain that has been ongoing prior to her endometrial biopsy.   Routine health care screening:  Mammogram 2016 Colonoscopy 2015   Problem List: Patient Active Problem List   Diagnosis Date Noted  . Endometrial cancer (Audrey Hart) 12/30/2016  . Menopause 09/16/2015  . Fatigue 09/16/2015  . Stress incontinence 09/16/2015  . 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 Cancer ([place])    skin ca    Past Surgical History: Past Surgical History:  Procedure Laterality Date  . OVARIAN CYST REMOVAL  1978    Past Gynecologic History:  Menarche: 12 Last Menstrual Period: Menopause 40's History of Abnormal pap: no, no abnormalities Last pap: 11/2016 [facility].     OB History:  OB History  Gravida Para Term Preterm AB Living  1 1          SAB TAB Ectopic Multiple Live Births               # Outcome Date GA Lbr Len/2nd Weight Sex Delivery Anes PTL Lv  1 Para               Family History: Family History  Problem Relation Age of Onset  . Heart disease Brother   . Clotting disorder Brother 46    blood clots associated with inactivity    Social History: Social History   Social History  . Marital status: Married    Spouse name: N/A  . Number of children: N/A  . Years of education: N/A   Occupational History  . Massage therapy     Social History Main Topics  . Smoking status: Former Research scientist (life sciences)  . Smokeless tobacco: Never Used  . Alcohol use 1.2 oz/week    2 Standard drinks or equivalent per week     Comment: occasional  . Drug use: No  . Sexual activity: Not on file   Other Topics Concern  . Not on file   Social History Narrative  . No narrative on file    Allergies: Allergies  Allergen Reactions  . Shrimp [Shellfish Allergy] Hives and Nausea And Vomiting  . Cantaloupe (Diagnostic) Hives    Current Medications: Current Outpatient Prescriptions  Medication Sig Dispense Refill  . acyclovir (ZOVIRAX) 400 MG tablet   0  . nystatin (MYCOSTATIN/NYSTOP) powder   1   No current facility-administered medications for this visit.     Review of Systems General: negative Skin: negative for changes in color, texture, moles or lesions Eyes: negative for, changes in vision HEENT: negative for, change in hearing, voice changes Pulmonary: negative for, dyspnea, productive cough Cardiac: negative for, palpitations, pain Gastrointestinal: negative for, nausea, vomiting, constipation, diarrhea, hematemesis, hematochezia Genitourinary/Sexual: negative for, dysuria, hesitancy, incontinence, blood Ob/Gyn: irregular bleeding and pelvic pain associated with the uterus Musculoskeletal: negative for, pain Hematology: negative for, easy bruising Neurologic/Psych: negative for, headaches, seizures, paralysis, weakness  Objective:  Physical  Examination:  BP (!) 148/88 (BP Location: Left Arm) Comment: re check  Pulse 73   Temp 97.7 F (36.5 C) (Tympanic)   Wt 228 lb 9.9 oz (103.7 kg)   BMI 38.04 kg/m    ECOG Performance Status: 0 - Asymptomatic  General appearance: alert, cooperative and appears stated age HEENT:PERRLA, extra ocular movement intact and sclera clear, anicteric Lymph node survey: non-palpable, axillary, inguinal, supraclavicular Cardiovascular: regular rate and rhythm Respiratory: normal air  entry, lungs clear to auscultation Abdomen: soft, non-tender, without masses or organomegaly, no hernias and well healed incision Back: inspection of back is normal Extremities: extremities normal, atraumatic, no cyanosis or edema Skin exam - normal coloration and turgor, no rashes, no suspicious skin lesions noted. Neurological exam reveals alert, oriented, normal speech, no focal findings or movement disorder noted.  Pelvic: exam chaperoned by nurse;  Vulva: normal appearing vulva with no masses, tenderness or lesions; Vagina: normal vagina; Adnexa: no masses; Uterus: uterus is normal size, shape, consistency and slightly tender to palpation, parametria smooth; Cervix: no lesions; Rectal: not indicated    Lab Review Labs on site today: Pending  Radiologic Imaging: CXR ordered    Assessment:  Audrey Hart is a 61 y.o. female diagnosed with grade 1  endometrial cancer. She desires definitive surgical treatment.   Pelvic pain of uncertain etiology, concerning for deep myometrial invasion. She had the pain prior to the biopsy so I do not think it is endometritis.   Medical co-morbidities complicating care: obesity.  Plan:   Problem List Items Addressed This Visit      Genitourinary   Endometrial cancer ([place]) - Primary   Relevant Medications   acyclovir (ZOVIRAX) 400 MG tablet      We discussed options for management including endometrial cancer, grade 1. Based on exam, we recommend definitive surgical evaluation.  Risks were discussed in detail. These include infection, anesthesia, bleeding, transfusion, wound separation, vaginal cuff dehiscence, medical issues (blood clots, stroke, heart attack, fluid in the lungs, pneumonia, abnormal heart rhythm, death), possible exploratory surgery with larger incision, lymphedema, lymphocyst, allergic reaction, injury to adjacent organs (bowel, bladder, blood vessels, nerves, ureters, uterus)   VTE and antibiotic prophylaxis ordered.  Given pelvic pain I have also ordered a CT scan of the abdomen/pelvis. We will order a non-contrast study.  She is allergic to shellfish so we will use methylene blue for the SLN injection.   Suggested return to clinic in  4-6 weeks after surgery.    The patient's diagnosis, an outline of the further diagnostic and laboratory studies which will be required, the recommendation, and alternatives were discussed.  All questions were answered to the patient's satisfaction.  A total of 60 minutes were spent with the patient/family today; 50% was spent in education, counseling and coordination of care for endometrial cancer.    Audrey Ends, Audrey Hart    CC:  Dr. Ouida Sills   PCPs:  Audrey Hart, Audrey Hart   and  Audrey Hess, Audrey Hart [address] [place], [place] [address] [phone]

## 2017-01-06 NOTE — Interval H&P Note (Signed)
History and Physical Interval Note:  01/06/2017 7:12 AM  Audrey Hart  has presented today for surgery, with the diagnosis of ENDOMETRIAL CANCER  The various methods of treatment have been discussed with the patient and family. After consideration of risks, benefits and other options for treatment, the patient has consented to  Procedure(s): ROBOTIC ASSISTED TOTAL HYSTERECTOMY WITH BILATERAL SALPINGO OOPHORECTOMY (Bilateral) SENTINEL NODE BIOPSY (N/A) ROBOTIC PELVIC AND PARA-AORTIC LYMPH NODE DISSECTION (N/A) as a surgical intervention .  The patient's history has been reviewed, patient examined, no change in status, stable for surgery.  I have reviewed the patient's chart and labs.  Questions were answered to the patient's satisfaction.    Reviewed CT scans - she has asymptomatic gall stones, precautions given, will refer to [place] if she develops symptoms.    [name]

## 2017-01-07 DIAGNOSIS — C541 Malignant neoplasm of endometrium: Secondary | ICD-10-CM | POA: Diagnosis not present

## 2017-01-07 LAB — CBC
HEMATOCRIT: 36.3 % (ref 35.0–47.0)
Hemoglobin: 12.4 g/dL (ref 12.0–16.0)
MCH: 31.1 pg (ref 26.0–34.0)
MCHC: 34.2 g/dL (ref 32.0–36.0)
MCV: 91.2 fL (ref 80.0–100.0)
Platelets: 263 10*3/uL (ref 150–440)
RBC: 3.98 MIL/uL (ref 3.80–5.20)
RDW: 14.3 % (ref 11.5–14.5)
WBC: 11.7 10*3/uL — ABNORMAL HIGH (ref 3.6–11.0)

## 2017-01-07 LAB — BASIC METABOLIC PANEL
Anion gap: 6 (ref 5–15)
BUN: 9 mg/dL (ref 6–20)
CALCIUM: 8.1 mg/dL — AB (ref 8.9–10.3)
CO2: 28 mmol/L (ref 22–32)
CREATININE: 0.6 mg/dL (ref 0.44–1.00)
Chloride: 108 mmol/L (ref 101–111)
GFR calc Af Amer: >60 mL/min (ref >60)
GFR calc non Af Amer: >60 mL/min (ref >60)
Glucose, Bld: 100 mg/dL — ABNORMAL HIGH (ref 65–99)
Potassium: 3.5 mmol/L (ref 3.5–5.1)
Sodium: 142 mmol/L (ref 135–145)

## 2017-01-07 MED ORDER — OXYCODONE HCL 5 MG PO CAPS: 5.0000 mg | ORAL_CAPSULE | Freq: Four times a day (QID) | ORAL | 0 refills | Status: DC | PRN

## 2017-01-07 MED ORDER — ACETAMINOPHEN 500 MG PO TABS: 1000.0000 mg | ORAL_TABLET | Freq: Four times a day (QID) | ORAL | 0 refills | Status: AC

## 2017-01-07 MED ORDER — IBUPROFEN 600 MG PO TABS: 600.0000 mg | ORAL_TABLET | Freq: Four times a day (QID) | ORAL | 0 refills | Status: DC

## 2017-01-07 MED ORDER — ONDANSETRON 4 MG PO TBDP: 4.0000 mg | ORAL_TABLET | Freq: Four times a day (QID) | ORAL | 0 refills | Status: DC | PRN

## 2017-01-07 MED ORDER — DOCUSATE SODIUM 100 MG PO CAPS: 100.0000 mg | ORAL_CAPSULE | Freq: Two times a day (BID) | ORAL | 0 refills | Status: DC

## 2017-01-07 NOTE — Discharge Summary (Signed)
Gynecology Physician Postoperative Discharge Summary  Patient ID: Audrey Hart MRN: [id number] DOB/AGE: Mar 20, 1956 61 y.o.  Admit Date: 01/06/2017 Discharge Date: 01/07/2017  Preoperative Diagnoses: FIGO Grade 1 Endometrial Cancer  Procedures: Procedure(s) (LRB): ROBOTIC ASSISTED TOTAL HYSTERECTOMY WITH BILATERAL SALPINGO OOPHORECTOMY (Bilateral) SENTINEL NODE BIOPSY (N/A) ROBOTIC PELVIC AND PARA-AORTIC LYMPH NODE DISSECTION (N/A)  CBC Latest Ref Rng & Units 01/07/2017 01/04/2017 11/30/2016  WBC 3.6 - 11.0 K/uL 11.7(H) 7.8 7.4  Hemoglobin 12.0 - 16.0 g/dL 12.4 13.6 14.4  Hematocrit 35.0 - 47.0 % 36.3 40.7 42.9  Platelets 150 - 440 K/uL 263 291 303    Hospital Course:  Audrey Hart is a 61 y.o. G1P1  admitted for scheduled surgery.  She underwent the procedures as mentioned above, her operation was uncomplicated. For further details about surgery, please refer to the operative report. Patient had an uncomplicated postoperative course. By time of discharge on POD#1, her pain was controlled on oral pain medications; she was ambulating, voiding without difficulty, tolerating regular diet and passing flatus. She was deemed stable for discharge to home.   Discharge Exam: Blood pressure (!) 111/55, pulse 73, temperature 98.2 F (36.8 C), temperature source Oral, resp. rate 18, height 5\' 5"  (1.651 m), weight 228 lb (103.4 kg), SpO2 97 %. General appearance: alert and no distress  Resp: clear to auscultation bilaterally, normal respiratory effort Cardio: regular rate and rhythm  GI: soft, non-tender; bowel sounds normal; no masses, no organomegaly.  Incisions: C/D/I, no erythema, no drainage noted Pelvic: scant blood on pad  Extremities: extremities normal, atraumatic, no cyanosis or edema and Homans sign is negative, no sign of DVT  Discharged Condition: Stable  Disposition: 01-Home or Self Care   Allergies as of 01/07/2017      Reactions   Cantaloupe (diagnostic) Hives   Shrimp  [shellfish Allergy] Hives, Nausea And Vomiting      Medication List    TAKE these medications   acetaminophen 500 MG tablet Commonly known as:  TYLENOL Take 2 tablets (1,000 mg total) by mouth every 6 (six) hours.   acyclovir 400 MG tablet Commonly known as:  ZOVIRAX Take 400 mg by mouth 2 (two) times daily as needed (for fever blisters).   docusate sodium 100 MG capsule Commonly known as:  COLACE Take 1 capsule (100 mg total) by mouth 2 (two) times daily. To keep stools soft   ibuprofen 600 MG tablet Commonly known as:  ADVIL,MOTRIN Take 1 tablet (600 mg total) by mouth every 6 (six) hours. What changed:  medication strength  how much to take  when to take this  reasons to take this   naproxen sodium 220 MG tablet Commonly known as:  ANAPROX Take 220-440 mg by mouth 2 (two) times daily as needed (for pain.).   nystatin powder Commonly known as:  MYCOSTATIN/NYSTOP Apply 1 g topically 2 (two) times daily as needed (for topical yeast).   ondansetron 4 MG disintegrating tablet Commonly known as:  ZOFRAN ODT Take 1 tablet (4 mg total) by mouth every 6 (six) hours as needed for nausea.   oxycodone 5 MG capsule Commonly known as:  OXY-IR Take 1 capsule (5 mg total) by mouth every 6 (six) hours as needed for pain.   PROBIOTIC PO Take 1 capsule by mouth daily. Fem-Dophilus      Follow-up Information    Honor Loh Ward, MD Follow up in 2 week(s).   Specialty:  Obstetrics and Gynecology Why:  for initial post op check Contact information: [address] [address] [phone]        Gillis Ends, MD Follow up in 6 week(s).   Specialty:  Obstetrics and Gynecology Why:  for post op exam Contact information: [facility] [place] [address] [phone]           Signed:  Benjaman Kindler Attending [place]

## 2017-01-07 NOTE — Anesthesia Postprocedure Evaluation (Signed)
Anesthesia Post Note  Patient: Audrey Hart  Procedure(s) Performed: Procedure(s) (LRB): ROBOTIC ASSISTED TOTAL HYSTERECTOMY WITH BILATERAL SALPINGO OOPHORECTOMY (Bilateral) SENTINEL NODE BIOPSY (N/A) ROBOTIC PELVIC AND PARA-AORTIC LYMPH NODE DISSECTION (N/A)  Patient location during evaluation: PACU Anesthesia Type: General Level of consciousness: awake and alert Pain management: pain level controlled Vital Signs Assessment: post-procedure vital signs reviewed and stable Respiratory status: spontaneous breathing, nonlabored ventilation, respiratory function stable and patient connected to nasal cannula oxygen Cardiovascular status: blood pressure returned to baseline and stable Postop Assessment: no signs of nausea or vomiting Anesthetic complications: no     Last Vitals:  Vitals:   01/07/17 0844 01/07/17 1104  BP: (!) 111/55 112/60  Pulse: 73 74  Resp: 18 18  Temp: 36.8 C 36.7 C    Last Pain:  Vitals:   01/07/17 1104  TempSrc: Oral  PainSc:                  Tyrin Herbers S

## 2017-01-07 NOTE — Discharge Instructions (Signed)
Discharge instructions:  Call office if you have any of the following: fever >101 F, chills, excessive vaginal bleeding, incision drainage or problems, leg pain or redness, or any other concerns.   Activity: Do not lift > 10 lbs for 8 weeks.  No intercourse or tampons for 8 weeks.  No driving for 1-2 weeks.   Please don't limit yourself in terms of routine activity.  You will be able to do most things, although they may take longer to do or be a little painful.  You can do it!  Don't be a hero, but don't be a wimp either!  Remember you should be getting better every day.  Please remember to walk around your home several times a day.

## 2017-01-07 NOTE — Progress Notes (Signed)
Discharge order received from doctor. Reviewed discharge instructions and prescriptions with patient and answered all questions. Follow up appointment instructions given. Patient verbalized understanding. Patient discharged home in stable condition via wheelchair by nursing/auxillary.    Hilbert Bible, RN

## 2017-01-13 ENCOUNTER — Telehealth: Payer: Self-pay

## 2017-01-13 NOTE — Telephone Encounter (Signed)
  Oncology Nurse Navigator Documentation Voicemail left with Audrey Hart that her pathology is not back at this time and we hope to have it by early next week. Left contact number for any questions. Navigator Location: [facility] (01/13/17 1600)   )Navigator Encounter Type: Telephone;Diagnostic Results (01/13/17 1600)                                                    Time Spent with Patient: 15 (01/13/17 1600)

## 2017-01-14 LAB — CYTOLOGY - NON PAP

## 2017-01-26 ENCOUNTER — Other Ambulatory Visit: Payer: Self-pay

## 2017-02-03 ENCOUNTER — Ambulatory Visit: Payer: BLUE CROSS/BLUE SHIELD

## 2017-02-03 ENCOUNTER — Inpatient Hospital Stay: Payer: BLUE CROSS/BLUE SHIELD | Attending: Obstetrics and Gynecology | Admitting: Obstetrics and Gynecology

## 2017-02-03 VITALS — BP 101/64 | HR 89 | Temp 98.2°F | Resp 18 | Ht 65.0 in | Wt 228.4 lb

## 2017-02-03 DIAGNOSIS — E669 Obesity, unspecified: Secondary | ICD-10-CM | POA: Diagnosis not present

## 2017-02-03 DIAGNOSIS — N393 Stress incontinence (female) (male): Secondary | ICD-10-CM | POA: Insufficient documentation

## 2017-02-03 DIAGNOSIS — Z87891 Personal history of nicotine dependence: Secondary | ICD-10-CM | POA: Diagnosis not present

## 2017-02-03 DIAGNOSIS — K8020 Calculus of gallbladder without cholecystitis without obstruction: Secondary | ICD-10-CM | POA: Diagnosis not present

## 2017-02-03 DIAGNOSIS — Z90710 Acquired absence of both cervix and uterus: Secondary | ICD-10-CM | POA: Diagnosis not present

## 2017-02-03 DIAGNOSIS — Z90722 Acquired absence of ovaries, bilateral: Secondary | ICD-10-CM

## 2017-02-03 DIAGNOSIS — C569 Malignant neoplasm of unspecified ovary: Secondary | ICD-10-CM | POA: Diagnosis not present

## 2017-02-03 DIAGNOSIS — Z6838 Body mass index (BMI) 38.0-38.9, adult: Secondary | ICD-10-CM | POA: Insufficient documentation

## 2017-02-03 DIAGNOSIS — C541 Malignant neoplasm of endometrium: Secondary | ICD-10-CM

## 2017-02-03 NOTE — Progress Notes (Signed)
  Oncology Nurse Navigator Documentation Chaperoned pelvic exam. Referral placed for Dr. Bary Castilla for symptomatic gallstones. Navigator Location: [facility] (02/03/17 1300)   )Navigator Encounter Type: Follow-up Appt (02/03/17 1300)                     Patient Visit Type: GynOnc (02/03/17 1300)                              Time Spent with Patient: 15 (02/03/17 1300)

## 2017-02-03 NOTE — Patient Instructions (Signed)
Pathology:  Pathology: DIAGNOSIS:  A. SENTINEL LYMPH NODE, RIGHT EXTERNAL ILIAC VEIN; EXCISION:  - NO TUMOR SEEN IN ONE LYMPH NODE (0/1).   B. PARATUBAL; BIOPSY:  - NO TUMOR SEEN.   C. PELVIC SIDEWALL; BIOPSY:  - NO TUMOR SEEN.   D. SENTINEL LYMPH NODE, LEFT EXTERNAL ILIAC VEIN; EXCISION:  - NO TUMOR SEEN (LYMPH NODE FRAGMENTS).   E. UTERUS WITH CERVIX, BILATERAL FALLOPIAN TUBES AND OVARIES;  HYSTERECTOMY WITH BILATERAL SALPINGO-OOPHORECTOMY:  - NON-INVASIVE ENDOMETRIOID ADENOCARCINOMA, FIGO GRADE I (SEE COMMENT).  - RIGHT OVARY INVOLVED BY LOW GRADE ADENOCARCINOMA (SEE COMMENT).  - NABOTHIAN CYSTS AND FOCAL CHRONIC CERVICITIS.  - UNREMARKABLE FALLOPIAN TUBES.  - CORTICAL INCLUSION CYSTS AND SIMPLE CYST OF LEFT OVARY.   Cytology:  DIAGNOSIS:  A. PELVIC WASHINGS:  - NEGATIVE FOR MALIGNANCY.   Pelvic Organ Prolapse Introduction Pelvic organ prolapse is the stretching, bulging, or dropping of pelvic organs into an abnormal position. It happens when the muscles and tissues that surround and support pelvic structures are stretched or weak. Pelvic organ prolapse can involve:  Vagina (vaginal prolapse).  Uterus (uterine prolapse).  Bladder (cystocele).  Rectum (rectocele).  Intestines (enterocele). When organs other than the vagina are involved, they often bulge into the vagina or protrude from the vagina, depending on how severe the prolapse is. What are the causes? Causes of this condition include:  Pregnancy, labor, and childbirth.  Long-lasting (chronic) cough.  Chronic constipation.  Obesity.  Past pelvic surgery.  Aging. During and after menopause, a decreased production of the hormone estrogen can weaken pelvic ligaments and muscles.  Consistently lifting more than 50 lb (23 kg).  Buildup of fluid in the abdomen due to certain diseases and other conditions. What are the signs or symptoms? Symptoms of this condition include:  Loss of bladder control  when you cough, sneeze, strain, and exercise (stress incontinence). This may be worse immediately following childbirth, and it may gradually improve over time.  Feeling pressure in your pelvis or vagina. This pressure may increase when you cough or when you are having a bowel movement.  A bulge that protrudes from the opening of your vagina or against your vaginal wall. If your uterus protrudes through the opening of your vagina and rubs against your clothing, you may also experience soreness, ulcers, infection, pain, and bleeding.  Increased effort to have a bowel movement or urinate.  Pain in your low back.  Pain, discomfort, or disinterest in sexual intercourse.  Repeated bladder infections (urinary tract infections).  Difficulty inserting or inability to insert a tampon or applicator. In some people, this condition does not cause any symptoms. How is this diagnosed? Your health care provider may perform an internal and external vaginal and rectal exam. During the exam, you may be asked to cough and strain while you are lying down, sitting, and standing up. Your health care provider will determine if other tests are required, such as bladder function tests. How is this treated? In most cases, this condition needs to be treated only if it produces symptoms. No treatment is guaranteed to correct the prolapse or relieve the symptoms completely. Treatment may include:  Lifestyle changes, such as:  Avoiding drinking beverages that contain caffeine.  Increasing your intake of high-fiber foods. This can help to decrease constipation and straining during bowel movements.  Emptying your bladder at scheduled times (bladder training therapy). This can help to reduce or avoid urinary incontinence.  Losing weight if you are overweight or obese.  Estrogen. Estrogen  may help mild prolapse by increasing the strength and tone of pelvic floor muscles.  Kegel exercises. These may help mild cases of  prolapse by strengthening and tightening the muscles of the pelvic floor.  Pessary insertion. A pessary is a soft, flexible device that is placed into your vagina by your health care provider to help support the vaginal walls and keep pelvic organs in place.  Surgery. This is often the only form of treatment for severe prolapse. Different types of surgeries are available. Follow these instructions at home:  Wear a sanitary pad or absorbent product if you have urinary incontinence.  Avoid heavy lifting and straining with exercise and work. Do not hold your breath when you perform mild to moderate lifting and exercise activities. Limit your activities as directed by your health care provider.  Take medicines only as directed by your health care provider.  Perform Kegel exercises as directed by your health care provider.  If you have a pessary, take care of it as directed by your health care provider. Contact a health care provider if:  Your symptoms interfere with your daily activities or sex life.  You need medicine to help with the discomfort.  You notice bleeding from the vagina that is not related to your period.  You have a fever.  You have pain or bleeding when you urinate.  You have bleeding when you have a bowel movement.  You lose urine when you have sex.  You have chronic constipation.  You have a pessary that falls out.  You have vaginal discharge that has a bad smell.  You have low abdominal pain or cramping that is unusual for you. This information is not intended to replace advice given to you by your health care provider. Make sure you discuss any questions you have with your health care provider. Document Released: 07/04/2014 Document Revised: 05/14/2016 Document Reviewed: 02/19/2014  2017 Elsevier     Ovarian Cancer The ovaries are the parts of the female reproductive system that produce eggs. Women have two ovaries. They are located on either side of  your uterus. Ovarian cancer is an abnormal growth of tissue (tumor) in one or both ovaries that is cancerous (malignant). Unlike noncancerous (benign) tumors, malignant tumors can spread to other parts of your body. What are the causes? The cause of ovarian cancer is unknown. What increases the risk? There are a number of risk factors that can increase your chances of getting ovarian cancer:  Being 35 years or older.  Having a personal or family history of endometrial, colon, breast, or ovarian cancer.  Having the BRCA1 and BRCA2 genes.  Using fertility medicines.  Starting menstruation before the age of 2 years.  Starting menopause after the age of 75 years.  Becoming pregnant for the first time at 71 years or older.  Never being pregnant.  Having hormone replacement therapy.  Being obese.  Having a personal history of endometriosis. What are the signs or symptoms? Early ovarian cancer often does not cause symptoms. As the cancer grows, symptoms may include:  Unexplained weight loss.  Abdominal pain, swelling, or bloating.  Pain and pressure in your back and pelvis.  Abnormal vaginal bleeding.  Loss of appetite.  Frequent urination.  Pain during sex.  Fatigue. How is this diagnosed? Your health care provider will ask about your medical history. He or she may also perform a number of tests, such as:  A pelvic exam. Your health care provider will feel the organs in  the pelvis for any lumps or changes in their shape or size.  Imaging tests, such as CT scans, ultrasound exams, or MRI.  Blood tests. Your cancer will be staged to determine its severity and extent. Staging is performed by your health care provider during surgery when the ovarian cancer is removed. Staging is done to find out the size of the tumor, whether the cancer has spread, and if so, to what parts of your body. You may need to have more tests to determine the stage of your cancer:  Stage I-The  cancer is found in only one or both ovaries.  Stage II-The cancer has spread to other parts of the pelvis, such as the uterus or fallopian tubes.  Stage III-The cancer has spread outside the pelvis to the abdominal cavity or to the lymph nodes in the abdomen.  Stage IV-The cancer has spread outside the abdomen to areas such as the liver or lungs. How is this treated? Women with ovarian cancer can be treated with surgery, chemotherapy, radiation, or any combination of the three. If the cancer is found at an early stage, surgery may be done to remove one ovary and its fallopian tube. For more advanced cases, surgery may be done to remove the uterus, cervix, fallopian tubes, and ovaries. Your health care provider may also remove the lymph nodes near the tumor and some tissue in the abdomen. Chemotherapy is the use of drugs to kill cancer cells. Chemotherapy may be used before or after the surgery. Radiation, the use of high energy X-rays to kill cancer cells, may be used in certain instances. Follow these instructions at home:  Take medicines only as directed by your health care provider.  Consider joining a support group. If you are feeling stressed because you have ovarian cancer, a support group may help you cope with the disease.  Seek advice to help you manage the treatment of side effects.  Keep all follow-up visits as directed by your health care provider. This is important. Contact a health care provider if:  You have a dull ache in your lower abdomen or groin.  You have increased abdominal pain, swelling, or bloating.  You have changes in your bowel or bladder function. This information is not intended to replace advice given to you by your health care provider. Make sure you discuss any questions you have with your health care provider. Document Released: 10/27/2004 Document Revised: 05/11/2016 Document Reviewed: 02/02/2014 Elsevier Interactive Patient Education  2017 Elsevier  Inc.   Cholelithiasis Cholelithiasis is also called "gallstones." It is a kind of gallbladder disease. The gallbladder is an organ that stores a liquid (bile) that helps you digest fat. Gallstones may not cause symptoms (may be silent gallstones) until they cause a blockage, and then they can cause pain (gallbladder attack). Follow these instructions at home:  Take over-the-counter and prescription medicines only as told by your doctor.  Stay at a healthy weight.  Eat healthy foods. This includes:  Eating fewer fatty foods, like fried foods.  Eating fewer refined carbs (refined carbohydrates). Refined carbs are breads and grains that are highly processed, like white bread and white rice. Instead, choose whole grains like whole-wheat bread and brown rice.  Eating more fiber. Almonds, fresh fruit, and beans are healthy sources of fiber.  Keep all follow-up visits as told by your doctor. This is important. Contact a doctor if:  You have sudden pain in the upper right side of your belly (abdomen). Pain might spread to  your right shoulder or your chest. This may be a sign of a gallbladder attack.  You feel sick to your stomach (are nauseous).  You throw up (vomit).  You have been diagnosed with gallstones that have no symptoms and you get:  Belly pain.  Discomfort, burning, or fullness in the upper part of your belly (indigestion). Get help right away if:  You have sudden pain in the upper right side of your belly, and it lasts for more than 2 hours.  You have belly pain that lasts for more than 5 hours.  You have a fever or chills.  You keep feeling sick to your stomach or you keep throwing up.  Your skin or the whites of your eyes turn yellow (jaundice).  You have dark-colored pee (urine).  You have light-colored poop (stool). Summary  Cholelithiasis is also called "gallstones."  The gallbladder is an organ that stores a liquid (bile) that helps you digest  fat.  Silent gallstones are gallstones that do not cause symptoms.  A gallbladder attack may cause sudden pain in the upper right side of your belly. Pain might spread to your right shoulder or your chest. If this happens, contact your doctor.  If you have sudden pain in the upper right side of your belly that lasts for more than 2 hours, get help right away. This information is not intended to replace advice given to you by your health care provider. Make sure you discuss any questions you have with your health care provider. Document Released: 05/25/2008 Document Revised: 08/23/2016 Document Reviewed: 08/23/2016 Elsevier Interactive Patient Education  2017 [employer].

## 2017-02-03 NOTE — Progress Notes (Signed)
Pt states that she saw dr ward for vaingal  Prolapse and she was going to fit her for something to help

## 2017-02-03 NOTE — Progress Notes (Signed)
Gynecologic Oncology Interval Visit   Gyn Providers: Dr. Ouida Sills Dr. Leonides Schanz  PCP: Paula Compton, NP   Chief Concern: postop visit for grade 1 endometrial cancer  Subjective:  Audrey Hart is a 61 y.o. female s/p oophorectomy for ovarian cysts in the 1978 who is seen in consultation from Dr. Ouida Sills for grade 1 endometrial cancer.   On 01/06/2017 she underwent robotic TLH, BSO, sentinel node injection, mapping, and bilateral pelvic sentinel lymph node biopsy. She was seen yesterday by Dr. Leonides Schanz for possible prolapse and she discussed pessary placement as well as pelvic PT with the patient. She also complains of indigestion. Her preop CT demonstrated cholelithiasis.   Pathology: DIAGNOSIS:  A. SENTINEL LYMPH NODE, RIGHT EXTERNAL ILIAC VEIN; EXCISION:  - NO TUMOR SEEN IN ONE LYMPH NODE (0/1).   B. PARATUBAL; BIOPSY:  - NO TUMOR SEEN.   C. PELVIC SIDEWALL; BIOPSY:  - NO TUMOR SEEN.   D. SENTINEL LYMPH NODE, LEFT EXTERNAL ILIAC VEIN; EXCISION:  - NO TUMOR SEEN (LYMPH NODE FRAGMENTS).   E. UTERUS WITH CERVIX, BILATERAL FALLOPIAN TUBES AND OVARIES;  HYSTERECTOMY WITH BILATERAL SALPINGO-OOPHORECTOMY:  - NON-INVASIVE ENDOMETRIOID ADENOCARCINOMA, FIGO GRADE I (SEE COMMENT).  - RIGHT OVARY INVOLVED BY LOW GRADE ADENOCARCINOMA (SEE COMMENT).  - NABOTHIAN CYSTS AND FOCAL CHRONIC CERVICITIS.  - UNREMARKABLE FALLOPIAN TUBES.  - CORTICAL INCLUSION CYSTS AND SIMPLE CYST OF LEFT OVARY.   Cytology:  DIAGNOSIS:  A. PELVIC WASHINGS:  - NEGATIVE FOR MALIGNANCY.  Recut on 2 blocks (A15 is still at [facility]) and did not appreciate any ovarian surface involvement.   [facility] Consultation Pathology Review: "Thank you for sending this diagnostically challenging case for consultation. Five representative slides are received and reviewed. Sections of the uterus demonstrate endometrial adenocarcinoma, FIGO grade 1 endometrioid type, without evidence of myometrial invasion or lymphovascular  invasion. Sections of the right ovary demonstrate a neoplastic process with similar architecture and cytomorphology, also felt to be consistent with FIGO grade 1 endometrioid adenocarcinoma. There is no endometriosis noted in the ovary to suggest a synchronous primary. However, the low grade of the endometrial tumor, along with the lack of myometrial invasion or lymphovascular invasion, would argue against metastasis. The fallopian tube demonstrates no evidence of malignancy. Unfortunately in cases such as this, the distinction between metastatic disease and synchronous primaries is not possible. This case was seen in consultation with Drs. Delmer Islam and Kerby Nora, whose opinions are reflected in the above interpretation."    Gynecologic Oncology History Audrey Hart is a pleasant female s/p oophorectomy for ovarian cysts in the 1978 who is seen in consultation from Dr. Ouida Sills for grade 1 endometrial cancer. EMBx 12/01/2016 revealed well differentiated endometrial cancer. Uterus sounded to 8 cm.     Routine health care screening:  Mammogram 2016 Colonoscopy 2015   Problem List: Patient Active Problem List   Diagnosis Date Noted  . Gallstones 02/03/2017  . Status post laparoscopic hysterectomy 01/06/2017  . Endometrial cancer ([place]) 12/30/2016  . Menopause 09/16/2015  . Fatigue 09/16/2015  . Stress incontinence 09/16/2015  . 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 Cancer ([place])    skin ca  . Fatigue   . Incontinence    STRESS  . Leg varices     Past Surgical History: Past Surgical History:  Procedure Laterality Date  . OVARIAN CYST REMOVAL  1978  . ROBOTIC ASSISTED TOTAL HYSTERECTOMY WITH BILATERAL SALPINGO OOPHERECTOMY Bilateral 01/06/2017   Procedure: ROBOTIC  ASSISTED TOTAL HYSTERECTOMY WITH BILATERAL SALPINGO OOPHORECTOMY;  Surgeon: Gillis Ends, MD;  Location: [facility] ORS;  Service: Gynecology;   Laterality: Bilateral;  . ROBOTIC PELVIC AND PARA-AORTIC LYMPH NODE DISSECTION N/A 01/06/2017   Procedure: ROBOTIC PELVIC AND PARA-AORTIC LYMPH NODE DISSECTION;  Surgeon: Gillis Ends, MD;  Location: [facility] ORS;  Service: Gynecology;  Laterality: N/A;  . SENTINEL NODE BIOPSY N/A 01/06/2017   Procedure: SENTINEL NODE BIOPSY;  Surgeon: Gillis Ends, MD;  Location: [facility] ORS;  Service: Gynecology;  Laterality: N/A;    Past Gynecologic History:  Menarche: 12 Last Menstrual Period: Menopause 40's History of Abnormal pap: no, no abnormalities Last pap: 11/2016 [facility].     OB History:  OB History  Gravida Para Term Preterm AB Living  1 1          SAB TAB Ectopic Multiple Live Births               # Outcome Date GA Lbr Len/2nd Weight Sex Delivery Anes PTL Lv  1 Para               Family History: Family History  Problem Relation Age of Onset  . Heart disease Brother   . Clotting disorder Brother 85    blood clots associated with inactivity    Social History: Social History   Social History  . Marital status: Married    Spouse name: N/A  . Number of children: N/A  . Years of education: N/A   Occupational History  . Massage therapy    Social History Main Topics  . Smoking status: Former Smoker    Quit date: 01/04/1989  . Smokeless tobacco: Never Used  . Alcohol use 1.2 oz/week    2 Standard drinks or equivalent per week     Comment: occasional  . Drug use: No  . Sexual activity: No   Other Topics Concern  . Not on file   Social History Narrative  . No narrative on file    Allergies: Allergies  Allergen Reactions  . Cantaloupe (Diagnostic) Hives  . Shrimp [Shellfish Allergy] Hives and Nausea And Vomiting    Current Medications: Current Outpatient Prescriptions  Medication Sig Dispense Refill  . acyclovir (ZOVIRAX) 400 MG tablet Take 400 mg by mouth 2 (two) times daily as needed (for fever blisters).   0  . nystatin (MYCOSTATIN/NYSTOP) powder  Apply 1 g topically 2 (two) times daily as needed (for topical yeast).   1  . Probiotic Product (PROBIOTIC PO) Take 1 capsule by mouth daily. Fem-Dophilus     No current facility-administered medications for this visit.     Review of Systems General: negative for, fatigue, changes in weight or appetite Skin: negative for skin problems Eyes: negative for, changes in vision HEENT: negative for, change in hearing, voice changes Pulmonary: negative for, dyspnea, productive cough Cardiac: negative for, pain Gastrointestinal: negative for, nausea, vomiting, constipation, diarrhea Genitourinary/Sexual: negative for, dysuria, incontinence,  Ob/Gyn: negative other than prolapse concerns Musculoskeletal: negative for, pain Neurologic/Psych: negative for, weakness, depression  Objective:  Physical Examination:  BP 101/64   Pulse 89   Temp 98.2 F (36.8 C) (Oral)   Resp 18   Ht _0  (1.651 m)   Wt 228 lb 6.3 oz (103.6 kg)   BMI 38.01 kg/m    ECOG Performance Status: 0 - Asymptomatic  General appearance: alert, cooperative and appears stated age HEENT:PERRLA, extra ocular movement intact and sclera clear, anicteric Lymph node survey: non-palpable inguinal nodes Abdomen:  soft, non-tender, without masses or organomegaly, no hernias and well healed incisions Extremities: extremities normal, atraumatic, no cyanosis or edema Skin exam - normal coloration and turgor, no rashes, no suspicious skin lesions noted. Neurological exam reveals alert, oriented, normal speech, no focal findings or movement disorder noted.  Pelvic: exam chaperoned by nurse;  Vulva: normal appearing vulva with no masses, tenderness or lesions; Vagina: normal vagina and healing cuff, sutures still intact, no erythema or drainage; Adnexa: no masses; Uterus/Cervix: surgically absent; Rectal: not indicated  Split speculum: mild to moderate cystocele; moderate rectocele; no evidence of enterocele. The vaginal cuff seems  supported.   Lab Review n/a  Radiologic Imaging: CT scan 01/01/2017 IMPRESSION: 1. Limited study without IV contrast. No intrahepatic biliary ductal dilatation. At least 2 calcified gallstones are noted within contracted gallbladder the largest measures 2 cm. 2. No small bowel or colonic obstruction. 3. No pericecal inflammation.  Normal appendix. 4. No ascites or free abdominal air. 5. Normal size uterus. There is isoechoic nodular filling defect within left fundal endometrial cavity measures 1.8 cm consistent with known history of endometrial malignancy. 6. No adnexal mass.  No pelvic sidewall adenopathy. 7. Mild degenerative changes thoracolumbar spine.  CXR 01/04/2017 IMPRESSION: No acute cardiopulmonary disease.     Assessment:  Audrey Hart is a 61 y.o. 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   Symptomatic cholelithiasis.   Pelvic organ prolapse.   Medical co-morbidities complicating care: obesity.  Plan:   Problem List Items Addressed This Visit      Digestive   Gallstones   Relevant Orders   Ambulatory referral to General Surgery     Genitourinary   Endometrial cancer [facility]) - Primary    Other Visit Diagnoses    Malignant neoplasm of ovary, unspecified laterality ([place])          We discussed options management for both of her cancers. No further therapy is required for either ovarian or endometrial cancer.   We can begin to alternate visits with Dr. Leonides Schanz for continued surveillance in  3 months. Then she can return to [place] clinic in 6 months.  If NED I have recommended continued close follow up with exams, including pelvic exams, and CA125 levels every 3 months for 2 years, every 4 months for the next year, then every 6 months until 5 years and then annually thereafter. We will order CA125 at her next visit. I recommended genetic testing. I will first request dMMR  testing for the tumors as that may help [place] define appropriate testing.   Imaging and laboratory assessment is based on clinical indication. Patient education for obesity, lifestyle, exercise, nutrition, smoking cessation, sexual health, vaginal lubricants. We discussed her weight and need for weight loss, stressed good nutrition, and exercise.  I provided information regarding calorie counting and exercise for weight loss. I offered referral to the CARE program and provided a pamphlet today.   Symptomatic cholelithiasis, [facility] consultation with Dr. Bary Castilla for evaluation. Referral placed today.   We discussed pelvic organ prolapse and she will follow up with Dr. Leonides Schanz for continued management.    The patient's diagnosis, an outline of the further diagnostic and laboratory studies which will be required, the recommendation, and alternatives were discussed.  All questions were answered to the patient's satisfaction.  A total of 60 minutes were spent with the patient/family today; 50% was spent in education, counseling and coordination of care  for endometrial cancer and ovarian cancer.   Gillis Ends, MD    CC:  Dr. Laverta Baltimore   Dr. Vikki Ports Ward  PCPs:  Paula Compton, NP   and  Glean Hess, MD [address] [place], [place] [address] [phone]

## 2017-02-04 ENCOUNTER — Encounter: Payer: Self-pay | Admitting: *Deleted

## 2017-02-10 ENCOUNTER — Ambulatory Visit: Payer: BLUE CROSS/BLUE SHIELD

## 2017-02-11 ENCOUNTER — Other Ambulatory Visit: Payer: Self-pay

## 2017-02-11 DIAGNOSIS — C541 Malignant neoplasm of endometrium: Secondary | ICD-10-CM

## 2017-02-15 ENCOUNTER — Other Ambulatory Visit: Payer: Self-pay | Admitting: Obstetrics & Gynecology

## 2017-02-15 ENCOUNTER — Other Ambulatory Visit: Payer: Self-pay | Admitting: Internal Medicine

## 2017-02-15 DIAGNOSIS — Z1231 Encounter for screening mammogram for malignant neoplasm of breast: Secondary | ICD-10-CM

## 2017-02-17 ENCOUNTER — Encounter: Payer: Self-pay | Admitting: General Surgery

## 2017-02-17 ENCOUNTER — Ambulatory Visit (INDEPENDENT_AMBULATORY_CARE_PROVIDER_SITE_OTHER): Payer: BLUE CROSS/BLUE SHIELD | Admitting: General Surgery

## 2017-02-17 ENCOUNTER — Ambulatory Visit
Admission: RE | Admit: 2017-02-17 | Discharge: 2017-02-17 | Disposition: A | Discharge status: Home / Self Care | Payer: BLUE CROSS/BLUE SHIELD | Source: Ambulatory Visit | Attending: Obstetrics & Gynecology | Admitting: Obstetrics & Gynecology

## 2017-02-17 VITALS — BP 138/78 | HR 74 | Resp 15 | Ht 64.0 in | Wt 224.0 lb

## 2017-02-17 DIAGNOSIS — K8020 Calculus of gallbladder without cholecystitis without obstruction: Secondary | ICD-10-CM

## 2017-02-17 DIAGNOSIS — Z1231 Encounter for screening mammogram for malignant neoplasm of breast: Secondary | ICD-10-CM | POA: Diagnosis not present

## 2017-02-17 HISTORY — DX: Malignant neoplasm of uterus, part unspecified: C55

## 2017-02-17 HISTORY — DX: Malignant neoplasm of unspecified ovary: C56.9

## 2017-02-17 NOTE — Progress Notes (Signed)
Patient ID: Audrey Hart, female   DOB: 1956/02/29, 61 y.o.   MRN: [id number]  Chief Complaint  Patient presents with  . Other    gallstones    HPI Audrey Hart is a 61 y.o. female her for evaluation of gallstones referred by Dr Theora Gianotti. She had a CT done on 01/01/17 following up from uterine and ovarian cancer which then showed gallstones.  She states she has had upper right abdominal pain under her rib within the past 6-8 months. She does have belching, but no nausea or vomiting. She does admit to reflux..She avoids pork. 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    HPI  Past Medical History:  Diagnosis Date  . Cancer ([place])    skin ca  . Fatigue   . Incontinence    STRESS  . Leg varices   . Ovarian cancer ([place]) 12/2016  . Uterine cancer ([place]) 11/2016    Past Surgical History:  Procedure Laterality Date  . ABDOMINAL HYSTERECTOMY  01/06/2017   total  . OVARIAN CYST REMOVAL  1978  . ROBOTIC ASSISTED TOTAL HYSTERECTOMY WITH BILATERAL SALPINGO OOPHERECTOMY Bilateral 01/06/2017   Procedure: ROBOTIC ASSISTED TOTAL HYSTERECTOMY WITH BILATERAL SALPINGO OOPHORECTOMY;  Surgeon: Gillis Ends, MD;  Location: [facility] ORS;  Service: Gynecology;  Laterality: Bilateral;  . ROBOTIC PELVIC AND PARA-AORTIC LYMPH NODE DISSECTION N/A 01/06/2017   Procedure: ROBOTIC PELVIC AND PARA-AORTIC LYMPH NODE DISSECTION;  Surgeon: Gillis Ends, MD;  Location: [facility] ORS;  Service: Gynecology;  Laterality: N/A;  . SENTINEL NODE BIOPSY N/A 01/06/2017   Procedure: SENTINEL NODE BIOPSY;  Surgeon: Gillis Ends, MD;  Location: [facility] ORS;  Service: Gynecology;  Laterality: N/A;    Family History  Problem Relation Age of Onset  . Heart disease Brother   . Clotting disorder Brother 58    blood clots associated with inactivity  . Breast cancer Neg Hx     Social History Social History  Substance Use Topics  . Smoking status: Former Smoker    Quit date: 01/04/1989  . Smokeless tobacco:  Never Used  . Alcohol use 1.2 oz/week    2 Standard drinks or equivalent per week     Comment: occasional    Allergies  Allergen Reactions  . Cantaloupe (Diagnostic) Hives  . Shrimp [Shellfish Allergy] Hives and Nausea And Vomiting    No current outpatient prescriptions on file.   No current facility-administered medications for this visit.     Review of Systems Review of Systems  Constitutional: Negative.   Respiratory: Negative.   Cardiovascular: Negative.   Gastrointestinal: Positive for abdominal pain. Negative for nausea and vomiting.    Blood pressure 138/78, pulse 74, resp. rate 15, height 5\' 4"  (1.626 m), weight 224 lb (101.6 kg).  Physical Exam Physical Exam  Constitutional: She is oriented to person, place, and time. She appears well-developed and well-nourished.  Eyes: Conjunctivae are normal. No scleral icterus.  Neck: Neck supple.  Cardiovascular: Normal rate, regular rhythm and normal heart sounds.   Pulmonary/Chest: Effort normal and breath sounds normal.  Abdominal: Soft. Normal appearance and bowel sounds are normal. There is no hepatomegaly. There is no tenderness. No hernia.  Lymphadenopathy:    She has no cervical adenopathy.  Neurological: She is oriented to person, place, and time.  Skin: Skin is warm and dry.    Data Reviewed 01/06/2017 operative note from her robot-assisted total hysterectomy and BSO.   01/01/2017 CT scan of the abdomen and pelvis reviewed. 2 calcified gallstones  and a contracted gallbladder measuring up to 2 cm in diameter. No evidence of intrahepatic ductal dilatation.   Assessment    Cholelithiasis, minimally symptomatic.  Mild increase in reflux/belching over the last 6-8 months.    Plan    The patient has no strong history of dietary intolerance. Whether her belching is related to her biliary tract is unclear but unlikely.  She has been asked to make use of a trial of OTC omeprazole to see if this improves her  belching and reflux symptoms. She is reluctantly agreed.  I anticipate she'll require biliary tract surgery during some time of the remaining 25 years estimated life expectancy. The patient has been asked a attention to whether any particular foods cause discomfort (not aware of at this time) and to notify the office if she would like to proceed with elective cholecystectomy.    Laparoscopic Cholecystectomy with Intraoperative Cholangiogram. The procedure, including it's potential risks and complications (including but not limited to infection, bleeding, injury to intra-abdominal organs or bile ducts, bile leak, poor cosmetic result, sepsis and death) were discussed with the patient in detail. Non-operative options, including their inherent risks (acute calculous cholecystitis with possible choledocholithiasis or gallstone pancreatitis, with the risk of ascending cholangitis, sepsis, and death) were discussed as well. The patient expressed and understanding of what we discussed and wishes to proceed with laparoscopic cholecystectomy. The patient further understands that if it is technically not possible, or it is unsafe to proceed laparoscopically, that I will convert to an open cholecystectomy.  This patient would like to have surgery most likely in the fall of this year. Patient was instructed to contact the office when she is ready to proceed. She will need to come to the office for a pre-operative visit one to two weeks prior to surgery.   This information has been scribed by Karie Fetch RN, BSN,BC.     Robert Bellow 02/18/2017, 11:48 AM

## 2017-02-17 NOTE — Patient Instructions (Addendum)
Laparoscopic Cholecystectomy Laparoscopic cholecystectomy is surgery to remove the gallbladder. The gallbladder is a pear-shaped organ that lies beneath the liver on the right side of the body. The gallbladder stores bile, which is a fluid that helps the body to digest fats. Cholecystectomy is often done for inflammation of the gallbladder (cholecystitis). This condition is usually caused by a buildup of gallstones (cholelithiasis) in the gallbladder. Gallstones can block the flow of bile, which can result in inflammation and pain. In severe cases, emergency surgery may be required. This procedure is done though small incisions in your abdomen (laparoscopic surgery). A thin scope with a camera (laparoscope) is inserted through one incision. Thin surgical instruments are inserted through the other incisions. In some cases, a laparoscopic procedure may be turned into a type of surgery that is done through a larger incision (open surgery). Tell a health care provider about:  Any allergies you have.  All medicines you are taking, including vitamins, herbs, eye drops, creams, and over-the-counter medicines.  Any problems you or family members have had with anesthetic medicines.  Any blood disorders you have.  Any surgeries you have had.  Any medical conditions you have.  Whether you are pregnant or may be pregnant. What are the risks? Generally, this is a safe procedure. However, problems may occur, including:  Infection.  Bleeding.  Allergic reactions to medicines.  Damage to other structures or organs.  A stone remaining in the common bile duct. The common bile duct carries bile from the gallbladder into the small intestine.  A bile leak from the cyst duct that is clipped when your gallbladder is removed. What happens before the procedure? Staying hydrated  Follow instructions from your health care provider about hydration, which may include:  Up to 2 hours before the procedure - you  may continue to drink clear liquids, such as water, clear fruit juice, black coffee, and plain tea. Eating and drinking restrictions  Follow instructions from your health care provider about eating and drinking, which may include:  8 hours before the procedure - stop eating heavy meals or foods such as meat, fried foods, or fatty foods.  6 hours before the procedure - stop eating light meals or foods, such as toast or cereal.  6 hours before the procedure - stop drinking milk or drinks that contain milk.  2 hours before the procedure - stop drinking clear liquids. Medicines   Ask your health care provider about:  Changing or stopping your regular medicines. This is especially important if you are taking diabetes medicines or blood thinners.  Taking medicines such as aspirin and ibuprofen. These medicines can thin your blood. Do not take these medicines before your procedure if your health care provider instructs you not to.  You may be given antibiotic medicine to help prevent infection. General instructions   Let your health care provider know if you develop a cold or an infection before surgery.  Plan to have someone take you home from the hospital or clinic.  Ask your health care provider how your surgical site will be marked or identified. What happens during the procedure?  To reduce your risk of infection:  Your health care team will wash or sanitize their hands.  Your skin will be washed with soap.  Hair may be removed from the surgical area.  An IV tube may be inserted into one of your veins.  You will be given one or more of the following:  A medicine to help you relax (  sedative).  A medicine to make you fall asleep (general anesthetic).  A breathing tube will be placed in your mouth.  Your surgeon will make several small cuts (incisions) in your abdomen.  The laparoscope will be inserted through one of the small incisions. The camera on the laparoscope will  send images to a TV screen (monitor) in the operating room. This lets your surgeon see inside your abdomen.  Air-like gas will be pumped into your abdomen. This will expand your abdomen to give the surgeon more room to perform the surgery.  Other tools that are needed for the procedure will be inserted through the other incisions. The gallbladder will be removed through one of the incisions.  Your common bile duct may be examined. If stones are found in the common bile duct, they may be removed.  After your gallbladder has been removed, the incisions will be closed with stitches (sutures), staples, or skin glue.  Your incisions may be covered with a bandage (dressing). The procedure may vary among health care providers and hospitals. What happens after the procedure?  Your blood pressure, heart rate, breathing rate, and blood oxygen level will be monitored until the medicines you were given have worn off.  You will be given medicines as needed to control your pain.  Do not drive for 24 hours if you were given a sedative. This information is not intended to replace advice given to you by your health care provider. Make sure you discuss any questions you have with your health care provider. Document Released: 12/07/2005 Document Revised: 06/28/2016 Document Reviewed: 05/25/2016 Elsevier Interactive Patient Education  2017 [place].     Patient to take omeprazole.for two weeks and call or send a my chart message.

## 2017-03-29 ENCOUNTER — Telehealth: Payer: Self-pay | Admitting: *Deleted

## 2017-03-29 NOTE — Telephone Encounter (Signed)
Patient's surgery has been scheduled for 05-28-17 at [facility].   Instructions will be reviewed with the patient at time of her pre-operative visit on 05-10-17 at 4:45 pm.

## 2017-04-07 LAB — SURGICAL PATHOLOGY

## 2017-05-10 ENCOUNTER — Other Ambulatory Visit: Payer: Self-pay | Admitting: General Surgery

## 2017-05-10 ENCOUNTER — Ambulatory Visit (INDEPENDENT_AMBULATORY_CARE_PROVIDER_SITE_OTHER): Payer: BLUE CROSS/BLUE SHIELD | Admitting: General Surgery

## 2017-05-10 ENCOUNTER — Encounter: Payer: Self-pay | Admitting: General Surgery

## 2017-05-10 VITALS — BP 140/84 | HR 71 | Resp 14 | Ht 64.0 in | Wt 223.0 lb

## 2017-05-10 DIAGNOSIS — K8020 Calculus of gallbladder without cholecystitis without obstruction: Secondary | ICD-10-CM | POA: Diagnosis not present

## 2017-05-10 NOTE — Progress Notes (Signed)
Patient ID: Audrey Hart, female   DOB: [birth year], 61 y.o.   MRN: [id number]  Chief Complaint  Patient presents with  . Pre-op Exam    HPI Audrey Hart is a 61 y.o. female here for a pre op for Cholecystectomy scheduled for 05/28/17. She reports that she is the same since last time. Having a lot of belching and indigestion. She reports nausea, mostly with fried foods. She is still unable to eat pork.  The patient reports she has made a full recovery from her robotically assisted hysterectomy and no dissection completed in January of this year. She was clear to point that this included her ability to resume marital relations.  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 She remains active in and outside the home.  She denies shortness of breath with activity or chest pain. No difficulty walking up a flight of steps. HPI  Past Medical History:  Diagnosis Date  . Cancer ([place])    skin ca  . Fatigue   . Incontinence    STRESS  . Leg varices   . Ovarian cancer ([place]) 12/2016  . Uterine cancer ([place]) 11/2016    Past Surgical History:  Procedure Laterality Date  . ABDOMINAL HYSTERECTOMY  01/06/2017   total  . OVARIAN CYST REMOVAL  1978  . ROBOTIC ASSISTED TOTAL HYSTERECTOMY WITH BILATERAL SALPINGO OOPHERECTOMY Bilateral 01/06/2017   Procedure: ROBOTIC ASSISTED TOTAL HYSTERECTOMY WITH BILATERAL SALPINGO OOPHORECTOMY;  Surgeon: Gillis Ends, MD;  Location: [facility] ORS;  Service: Gynecology;  Laterality: Bilateral;  . ROBOTIC PELVIC AND PARA-AORTIC LYMPH NODE DISSECTION N/A 01/06/2017   Procedure: ROBOTIC PELVIC AND PARA-AORTIC LYMPH NODE DISSECTION;  Surgeon: Gillis Ends, MD;  Location: [facility] ORS;  Service: Gynecology;  Laterality: N/A;  . SENTINEL NODE BIOPSY N/A 01/06/2017   Procedure: SENTINEL NODE BIOPSY;  Surgeon: Gillis Ends, MD;  Location: [facility] ORS;  Service: Gynecology;  Laterality: N/A;    Family History  Problem Relation  Age of Onset  . Heart disease Brother   . Clotting disorder Brother 35       blood clots associated with inactivity  . Breast cancer Neg Hx     Social History Social History  Substance Use Topics  . Smoking status: Former Smoker    Quit date: 01/04/1989  . Smokeless tobacco: Never Used  . Alcohol use 1.2 oz/week    2 Standard drinks or equivalent per week     Comment: occasional    Allergies  Allergen Reactions  . Cantaloupe (Diagnostic) Hives  . Shrimp [Shellfish Allergy] Hives and Nausea And Vomiting    No current outpatient prescriptions on file.   No current facility-administered medications for this visit.     Review of Systems Review of Systems  Constitutional: Negative.   Respiratory: Negative.  Negative for chest tightness and shortness of breath.   Cardiovascular: Negative.  Negative for chest pain.  Gastrointestinal: Negative.     Blood pressure 140/84, pulse 71, resp. rate 14, height 5\' 4"  (1.626 m), weight 223 lb (101.2 kg).  Physical Exam Physical Exam  Constitutional: She is oriented to person, place, and time. She appears well-developed and well-nourished.  Eyes: Conjunctivae are normal. No scleral icterus.  Neck: Neck supple.  Cardiovascular: Normal rate, regular rhythm, normal heart sounds and normal pulses.   Pulmonary/Chest: Effort normal and breath sounds normal.  Abdominal: Soft. Normal appearance and bowel sounds are normal.  Lymphadenopathy:    She has no cervical  adenopathy.  Neurological: She is alert and oriented to person, place, and time.  Skin: Skin is warm and dry.  Psychiatric: She has a normal mood and affect.    Data Reviewed CT scan of the abdomen and pelvis completed 01/01/2017 obtained prior to her hysterectomy showed at least 2 calcified gallstones within a contracted gallbladder. The largest measured 12 mm. 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 Right ovary involved by  low-grade adenocarcinoma.   Comprehensive metabolic panel dated 65/78/4696 showed normal liver function studies with a total bilirubin of 0.6. Normal transaminases. Electrolytes unremarkable except for mobile depression of the potassium at 3.4.  Assessment    Cholelithiasis.    Plan    The patient was advised that her belching may or may not be improved with cholecystectomy. Postprandial pain after fatty food should be improved.  The patient did very well after her robotically assisted hysterectomy/BSO/node dissection.  The patient reports that she developed her shellfish allergy when she spent a [place] at the beach and was eating shrimp almost every day. During the third week she began to notice a reaction when handling the shrimp. She has avoided shellfish since that time.    Laparoscopic Cholecystectomy with Intraoperative Cholangiogram. The procedure, including it's potential risks and complications (including but not limited to infection, bleeding, injury to intra-abdominal organs or bile ducts, bile leak, poor cosmetic result, sepsis and death) were discussed with the patient in detail. Non-operative options, including their inherent risks (acute calculous cholecystitis with possible choledocholithiasis or gallstone pancreatitis, with the risk of ascending cholangitis, sepsis, and death) were discussed as well. The patient expressed and understanding of what we discussed and wishes to proceed with laparoscopic cholecystectomy. The patient further understands that if it is technically not possible, or it is unsafe to proceed laparoscopically, that I will convert to an open cholecystectomy.  The patient's gallstones were identified on CT, and for this reason we don't really have a good assessment of the common bile duct. If indeed she just has multiple large stones, we'll likely not do cholangiograms. If there is a question about ductal anatomy at the time of surgery she will be medicated with  Benadryl and steroids.  HPI, Physical Exam, Assessment and Plan have been scribed under the direction and in the presence of Robert Bellow, MD  Concepcion Living, LPN  I have completed the exam and reviewed the above documentation for accuracy and completeness.  I agree with the above.  Haematologist has been used and any errors in dictation or transcription are unintentional.  Hervey Ard, M.D., F.A.C.S.   Robert Bellow 05/10/2017, 8:10 PM   Patient's surgery has been scheduled for 05-28-17 at [facility].   Dominga Ferry, CMA

## 2017-05-21 ENCOUNTER — Encounter
Admission: RE | Admit: 2017-05-21 | Discharge: 2017-05-21 | Disposition: A | Discharge status: Home / Self Care | Payer: BLUE CROSS/BLUE SHIELD | Source: Ambulatory Visit | Attending: General Surgery | Admitting: General Surgery

## 2017-05-21 NOTE — Patient Instructions (Signed)
  Your procedure is scheduled on: 05-28-17 Report to Same Day Surgery 2nd floor medical mall [facility] Entrance-take elevator on left to 2nd floor.  Check in with surgery information desk.) To find out your arrival time please call [phone] between 1PM - 3PM on 05-27-17  Remember: Instructions that are not followed completely may result in serious medical risk, up to and including death, or upon the discretion of your surgeon and anesthesiologist your surgery may need to be rescheduled.    _x___ 1. Do not eat food or drink liquids after midnight. No gum chewing or hard candies.     __x__ 2. No Alcohol for 24 hours before or after surgery.   __x__3. No Smoking for 24 prior to surgery.   ____  4. Bring all medications with you on the day of surgery if instructed.    __x__ 5. Notify your doctor if there is any change in your medical condition     (cold, fever, infections).     Do not wear jewelry, make-up, hairpins, clips or nail polish.  Do not wear lotions, powders, or perfumes. You may wear deodorant.  Do not shave 48 hours prior to surgery. Men may shave face and neck.  Do not bring valuables to the hospital.    [facility] is not responsible for any belongings or valuables.               Contacts, dentures or bridgework may not be worn into surgery.  Leave your suitcase in the car. After surgery it may be brought to your room.  For patients admitted to the hospital, discharge time is determined by your treatment team.   Patients discharged the day of surgery will not be allowed to drive home.  You will need someone to drive you home and stay with you the night of your procedure.    Please read over the following fact sheets that you were given:     ____ Take anti-hypertensive (unless it includes a diuretic), cardiac, seizure, asthma,     anti-reflux and psychiatric medicines. These include:  1. NONE  2.  3.  4.  5.  6.  ____Fleets enema or Magnesium Citrate as  directed.   ____ Use CHG Soap or sage wipes as directed on instruction sheet   ____ Use inhalers on the day of surgery and bring to hospital day of surgery  ____ Stop Metformin and Janumet 2 days prior to surgery.    ____ Take 1/2 of usual insulin dose the night before surgery and none on the morning surgery.   ____ Follow recommendations from Cardiologist, Pulmonologist or PCP regarding stopping Aspirin, Coumadin, Pllavix ,Eliquis, Effient, or Pradaxa, and Pletal.  ____Stop Anti-inflammatories such as Advil, Aleve, Ibuprofen, Motrin, Naproxen, Naprosyn, Goodies powders or aspirin products. OK to take Tylenol    ____ Stop supplements until after surgery.   ____ Bring C-Pap to the hospital.

## 2017-05-28 ENCOUNTER — Ambulatory Visit: Payer: BLUE CROSS/BLUE SHIELD | Admitting: Certified Registered"

## 2017-05-28 ENCOUNTER — Ambulatory Visit: Payer: BLUE CROSS/BLUE SHIELD

## 2017-05-28 ENCOUNTER — Ambulatory Visit
Admission: RE | Admit: 2017-05-28 | Discharge: 2017-05-28 | Disposition: A | Discharge status: Home / Self Care | Payer: BLUE CROSS/BLUE SHIELD | Source: Ambulatory Visit | Attending: General Surgery | Admitting: General Surgery

## 2017-05-28 ENCOUNTER — Encounter
Admission: RE | Disposition: A | Discharge status: Home / Self Care | Payer: Self-pay | Source: Ambulatory Visit | Attending: General Surgery

## 2017-05-28 ENCOUNTER — Encounter: Payer: Self-pay | Admitting: *Deleted

## 2017-05-28 DIAGNOSIS — Z90710 Acquired absence of both cervix and uterus: Secondary | ICD-10-CM | POA: Diagnosis not present

## 2017-05-28 DIAGNOSIS — Z91018 Allergy to other foods: Secondary | ICD-10-CM | POA: Insufficient documentation

## 2017-05-28 DIAGNOSIS — K8020 Calculus of gallbladder without cholecystitis without obstruction: Secondary | ICD-10-CM

## 2017-05-28 DIAGNOSIS — Z8249 Family history of ischemic heart disease and other diseases of the circulatory system: Secondary | ICD-10-CM | POA: Diagnosis not present

## 2017-05-28 DIAGNOSIS — K8010 Calculus of gallbladder with chronic cholecystitis without obstruction: Secondary | ICD-10-CM | POA: Diagnosis present

## 2017-05-28 DIAGNOSIS — Z832 Family history of diseases of the blood and blood-forming organs and certain disorders involving the immune mechanism: Secondary | ICD-10-CM | POA: Insufficient documentation

## 2017-05-28 DIAGNOSIS — Z8543 Personal history of malignant neoplasm of ovary: Secondary | ICD-10-CM | POA: Insufficient documentation

## 2017-05-28 DIAGNOSIS — Z8542 Personal history of malignant neoplasm of other parts of uterus: Secondary | ICD-10-CM | POA: Diagnosis not present

## 2017-05-28 DIAGNOSIS — Z85828 Personal history of other malignant neoplasm of skin: Secondary | ICD-10-CM | POA: Diagnosis not present

## 2017-05-28 DIAGNOSIS — Z87891 Personal history of nicotine dependence: Secondary | ICD-10-CM | POA: Insufficient documentation

## 2017-05-28 DIAGNOSIS — Z419 Encounter for procedure for purposes other than remedying health state, unspecified: Secondary | ICD-10-CM

## 2017-05-28 HISTORY — PX: CHOLECYSTECTOMY: SHX55

## 2017-05-28 SURGERY — LAPAROSCOPIC CHOLECYSTECTOMY WITH INTRAOPERATIVE CHOLANGIOGRAM
Anesthesia: General | Site: Abdomen | Wound class: Clean Contaminated

## 2017-05-28 MED ORDER — PROPOFOL 10 MG/ML IV BOLUS
INTRAVENOUS | Status: AC
  Filled 2017-05-28: qty 20

## 2017-05-28 MED ORDER — OXYCODONE HCL 5 MG/5ML PO SOLN: 5.0000 mg | Freq: Once | ORAL | Status: AC | PRN

## 2017-05-28 MED ORDER — SODIUM CHLORIDE 0.9 % IV SOLN
INTRAVENOUS | Status: DC | PRN
  Administered 2017-05-28: 20 mL

## 2017-05-28 MED ORDER — DEXAMETHASONE SODIUM PHOSPHATE 10 MG/ML IJ SOLN
INTRAMUSCULAR | Status: DC | PRN
  Administered 2017-05-28: 4 mg via INTRAVENOUS

## 2017-05-28 MED ORDER — MEPERIDINE HCL 50 MG/ML IJ SOLN: 6.2500 mg | INTRAMUSCULAR | Status: DC | PRN

## 2017-05-28 MED ORDER — ROCURONIUM BROMIDE 50 MG/5ML IV SOLN
INTRAVENOUS | Status: AC
  Filled 2017-05-28: qty 1

## 2017-05-28 MED ORDER — ONDANSETRON HCL 4 MG/2ML IJ SOLN
INTRAMUSCULAR | Status: AC
  Filled 2017-05-28: qty 2

## 2017-05-28 MED ORDER — SUGAMMADEX SODIUM 200 MG/2ML IV SOLN
INTRAVENOUS | Status: AC
  Filled 2017-05-28: qty 2

## 2017-05-28 MED ORDER — GLYCOPYRROLATE 0.2 MG/ML IJ SOLN
INTRAMUSCULAR | Status: DC | PRN
  Administered 2017-05-28: 0.2 mg via INTRAVENOUS

## 2017-05-28 MED ORDER — FENTANYL CITRATE (PF) 100 MCG/2ML IJ SOLN
INTRAMUSCULAR | Status: AC
  Filled 2017-05-28: qty 2

## 2017-05-28 MED ORDER — EPHEDRINE SULFATE 50 MG/ML IJ SOLN
INTRAMUSCULAR | Status: DC | PRN
  Administered 2017-05-28 (×2): 10 mg via INTRAVENOUS
  Administered 2017-05-28: 5 mg via INTRAVENOUS

## 2017-05-28 MED ORDER — FENTANYL CITRATE (PF) 100 MCG/2ML IJ SOLN
INTRAMUSCULAR | Status: AC
  Administered 2017-05-28: 25 ug via INTRAVENOUS
  Filled 2017-05-28: qty 2

## 2017-05-28 MED ORDER — SUCCINYLCHOLINE CHLORIDE 20 MG/ML IJ SOLN
INTRAMUSCULAR | Status: AC
  Filled 2017-05-28: qty 1

## 2017-05-28 MED ORDER — SUGAMMADEX SODIUM 200 MG/2ML IV SOLN
INTRAVENOUS | Status: DC | PRN
  Administered 2017-05-28: 200 mg via INTRAVENOUS

## 2017-05-28 MED ORDER — OXYCODONE HCL 5 MG PO TABS
5.0000 mg | ORAL_TABLET | Freq: Once | ORAL | Status: AC | PRN
  Administered 2017-05-28: 5 mg via ORAL

## 2017-05-28 MED ORDER — LACTATED RINGERS IV SOLN
INTRAVENOUS | Status: DC
  Administered 2017-05-28: 07:00:00 via INTRAVENOUS

## 2017-05-28 MED ORDER — PROPOFOL 10 MG/ML IV BOLUS
INTRAVENOUS | Status: DC | PRN
  Administered 2017-05-28: 150 mg via INTRAVENOUS

## 2017-05-28 MED ORDER — FENTANYL CITRATE (PF) 100 MCG/2ML IJ SOLN
25.0000 ug | INTRAMUSCULAR | Status: DC | PRN
  Administered 2017-05-28 (×5): 25 ug via INTRAVENOUS

## 2017-05-28 MED ORDER — ACETAMINOPHEN 10 MG/ML IV SOLN
INTRAVENOUS | Status: DC | PRN
  Administered 2017-05-28: 1000 mg via INTRAVENOUS

## 2017-05-28 MED ORDER — HEPARIN SODIUM (PORCINE) 5000 UNIT/ML IJ SOLN
5000.0000 [IU] | Freq: Once | INTRAMUSCULAR | Status: AC
  Administered 2017-05-28: 5000 [IU] via SUBCUTANEOUS

## 2017-05-28 MED ORDER — KETOROLAC TROMETHAMINE 30 MG/ML IJ SOLN
INTRAMUSCULAR | Status: AC
  Filled 2017-05-28: qty 1

## 2017-05-28 MED ORDER — HEPARIN SODIUM (PORCINE) 5000 UNIT/ML IJ SOLN
INTRAMUSCULAR | Status: AC
  Filled 2017-05-28: qty 1

## 2017-05-28 MED ORDER — ACETAMINOPHEN 10 MG/ML IV SOLN
INTRAVENOUS | Status: AC
  Filled 2017-05-28: qty 100

## 2017-05-28 MED ORDER — SUCCINYLCHOLINE CHLORIDE 20 MG/ML IJ SOLN
INTRAMUSCULAR | Status: DC | PRN
  Administered 2017-05-28: 100 mg via INTRAVENOUS

## 2017-05-28 MED ORDER — FENTANYL CITRATE (PF) 100 MCG/2ML IJ SOLN
INTRAMUSCULAR | Status: DC | PRN
  Administered 2017-05-28 (×3): 50 ug via INTRAVENOUS

## 2017-05-28 MED ORDER — OXYCODONE HCL 5 MG PO TABS
ORAL_TABLET | ORAL | Status: AC
  Administered 2017-05-28: 5 mg via ORAL
  Filled 2017-05-28: qty 1

## 2017-05-28 MED ORDER — PROMETHAZINE HCL 25 MG/ML IJ SOLN: 6.2500 mg | INTRAMUSCULAR | Status: DC | PRN

## 2017-05-28 MED ORDER — KETOROLAC TROMETHAMINE 30 MG/ML IJ SOLN
INTRAMUSCULAR | Status: DC | PRN
  Administered 2017-05-28: 30 mg via INTRAVENOUS

## 2017-05-28 MED ORDER — MIDAZOLAM HCL 2 MG/2ML IJ SOLN
INTRAMUSCULAR | Status: AC
  Filled 2017-05-28: qty 2

## 2017-05-28 MED ORDER — FAMOTIDINE 20 MG PO TABS
20.0000 mg | ORAL_TABLET | Freq: Once | ORAL | Status: AC
  Administered 2017-05-28: 20 mg via ORAL

## 2017-05-28 MED ORDER — DEXAMETHASONE SODIUM PHOSPHATE 10 MG/ML IJ SOLN
INTRAMUSCULAR | Status: AC
Start: 2017-05-28 — End: 2017-05-28
  Filled 2017-05-28: qty 1

## 2017-05-28 MED ORDER — PHENYLEPHRINE HCL 10 MG/ML IJ SOLN
INTRAMUSCULAR | Status: AC
  Filled 2017-05-28: qty 1

## 2017-05-28 MED ORDER — ROCURONIUM BROMIDE 100 MG/10ML IV SOLN
INTRAVENOUS | Status: DC | PRN
  Administered 2017-05-28: 10 mg via INTRAVENOUS
  Administered 2017-05-28: 30 mg via INTRAVENOUS
  Administered 2017-05-28: 10 mg via INTRAVENOUS

## 2017-05-28 MED ORDER — LIDOCAINE HCL (PF) 2 % IJ SOLN
INTRAMUSCULAR | Status: AC
  Filled 2017-05-28: qty 2

## 2017-05-28 MED ORDER — MIDAZOLAM HCL 2 MG/2ML IJ SOLN
INTRAMUSCULAR | Status: DC | PRN
  Administered 2017-05-28: 2 mg via INTRAVENOUS

## 2017-05-28 MED ORDER — ONDANSETRON HCL 4 MG/2ML IJ SOLN
INTRAMUSCULAR | Status: DC | PRN
  Administered 2017-05-28: 4 mg via INTRAVENOUS

## 2017-05-28 MED ORDER — FAMOTIDINE 20 MG PO TABS
ORAL_TABLET | ORAL | Status: AC
  Filled 2017-05-28: qty 1

## 2017-05-28 MED ORDER — LIDOCAINE HCL (CARDIAC) 20 MG/ML IV SOLN
INTRAVENOUS | Status: DC | PRN
  Administered 2017-05-28: 100 mg via INTRAVENOUS

## 2017-05-28 MED ORDER — HYDROCODONE-ACETAMINOPHEN 5-325 MG PO TABS: 1.0000 | ORAL_TABLET | ORAL | 0 refills | Status: DC | PRN

## 2017-05-28 SURGICAL SUPPLY — 46 items
APPLIER CLIP ROT 10 11.4 M/L (STAPLE) ×3
BLADE SURG 11 STRL SS SAFETY (MISCELLANEOUS) ×3 IMPLANT
CANISTER SUCT 1200ML W/VALVE (MISCELLANEOUS) ×3 IMPLANT
CANNULA DILATOR  5MM W/SLV (CANNULA) ×2
CANNULA DILATOR 10 W/SLV (CANNULA) ×2 IMPLANT
CANNULA DILATOR 10MM W/SLV (CANNULA) ×1
CANNULA DILATOR 5 W/SLV (CANNULA) ×4 IMPLANT
CATH CHOLANG 76X19 KUMAR (CATHETERS) ×6 IMPLANT
CHLORAPREP W/TINT 26ML (MISCELLANEOUS) ×3 IMPLANT
CLIP APPLIE ROT 10 11.4 M/L (STAPLE) ×1 IMPLANT
CLOSURE WOUND 1/2 X4 (GAUZE/BANDAGES/DRESSINGS) ×1
CONRAY 60ML FOR OR (MISCELLANEOUS) ×3 IMPLANT
CORD MONOPOLAR M/FML 12FT (MISCELLANEOUS) ×9 IMPLANT
DISSECTOR KITTNER STICK (MISCELLANEOUS) ×1 IMPLANT
DISSECTORS/KITTNER STICK (MISCELLANEOUS) ×3
DRAPE SHEET LG 3/4 BI-LAMINATE (DRAPES) ×3 IMPLANT
DRSG TEGADERM 2-3/8X2-3/4 SM (GAUZE/BANDAGES/DRESSINGS) ×12 IMPLANT
DRSG TELFA 4X3 1S NADH ST (GAUZE/BANDAGES/DRESSINGS) ×3 IMPLANT
ELECT REM PT RETURN 9FT ADLT (ELECTROSURGICAL) ×3
ELECTRODE REM PT RTRN 9FT ADLT (ELECTROSURGICAL) ×1 IMPLANT
ENDOPOUCH RETRIEVER 10 (MISCELLANEOUS) ×3 IMPLANT
GLOVE BIO SURGEON STRL SZ7.5 (GLOVE) ×3 IMPLANT
GLOVE BIOGEL M 6.5 STRL (GLOVE) ×6 IMPLANT
GLOVE BIOGEL PI IND STRL 6.5 (GLOVE) ×2 IMPLANT
GLOVE BIOGEL PI INDICATOR 6.5 (GLOVE) ×4
GLOVE INDICATOR 8.0 STRL GRN (GLOVE) ×3 IMPLANT
GOWN STRL REUS W/ TWL LRG LVL3 (GOWN DISPOSABLE) ×3 IMPLANT
GOWN STRL REUS W/TWL LRG LVL3 (GOWN DISPOSABLE) ×6
IRRIGATION STRYKERFLOW (MISCELLANEOUS) ×1 IMPLANT
IRRIGATOR STRYKERFLOW (MISCELLANEOUS) ×3
IV LACTATED RINGERS 1000ML (IV SOLUTION) ×3 IMPLANT
KIT RM TURNOVER STRD PROC AR (KITS) ×3 IMPLANT
LABEL OR SOLS (LABEL) ×3 IMPLANT
NDL INSUFF ACCESS 14 VERSASTEP (NEEDLE) ×3 IMPLANT
NS IRRIG 500ML POUR BTL (IV SOLUTION) ×3 IMPLANT
PACK LAP CHOLECYSTECTOMY (MISCELLANEOUS) ×3 IMPLANT
SCISSORS METZENBAUM CVD 33 (INSTRUMENTS) ×3 IMPLANT
SEAL FOR SCOPE WARMER C3101 (MISCELLANEOUS) ×3 IMPLANT
STRIP CLOSURE SKIN 1/2X4 (GAUZE/BANDAGES/DRESSINGS) ×2 IMPLANT
SUT VIC AB 0 CT2 27 (SUTURE) ×3 IMPLANT
SUT VIC AB 4-0 FS2 27 (SUTURE) ×3 IMPLANT
SWABSTK COMLB BENZOIN TINCTURE (MISCELLANEOUS) ×3 IMPLANT
TROCAR XCEL NON-BLD 11X100MML (ENDOMECHANICALS) ×3 IMPLANT
TUBING INSUFFLATION (TUBING) ×3 IMPLANT
TUBING INSUFFLATOR HI FLOW (MISCELLANEOUS) ×3 IMPLANT
WATER STERILE IRR 1000ML POUR (IV SOLUTION) ×3 IMPLANT

## 2017-05-28 NOTE — Anesthesia Postprocedure Evaluation (Signed)
Anesthesia Post Note  Patient: Audrey Hart  Procedure(s) Performed: Procedure(s) (LRB): LAPAROSCOPIC CHOLECYSTECTOMY WITH INTRAOPERATIVE CHOLANGIOGRAM (N/A)  Patient location during evaluation: PACU Anesthesia Type: General Level of consciousness: awake and alert and oriented Pain management: pain level controlled Vital Signs Assessment: post-procedure vital signs reviewed and stable Respiratory status: spontaneous breathing, nonlabored ventilation and respiratory function stable Cardiovascular status: blood pressure returned to baseline and stable Postop Assessment: no signs of nausea or vomiting Anesthetic complications: no     Last Vitals:  Vitals:   05/28/17 0941 05/28/17 0947  BP: 111/74 111/70  Pulse: 71 64  Resp: 19 13  Temp:      Last Pain:  Vitals:   05/28/17 0941  TempSrc:   PainSc: 5                  Richar Dunklee

## 2017-05-28 NOTE — H&P (Signed)
No interval change in clinical history or exam. 

## 2017-05-28 NOTE — Anesthesia Preprocedure Evaluation (Addendum)
Anesthesia Evaluation  Patient identified by MRN, date of birth, ID band Patient awake    Reviewed: Allergy & Precautions, NPO status , Patient's Chart, lab work & pertinent test results  History of Anesthesia Complications Negative for: history of anesthetic complications  Airway Mallampati: II  TM Distance: >3 FB Neck ROM: Full    Dental  (+)    Pulmonary neg sleep apnea, neg COPD, former smoker,    breath sounds clear to auscultation- rhonchi (-) wheezing      Cardiovascular Exercise Tolerance: Good (-) hypertension(-) CAD and (-) Past MI  Rhythm:Regular Rate:Normal - Systolic murmurs and - Diastolic murmurs    Neuro/Psych negative neurological ROS  negative psych ROS   GI/Hepatic negative GI ROS, Neg liver ROS,   Endo/Other  negative endocrine ROSneg diabetes  Renal/GU negative Renal ROS     Musculoskeletal negative musculoskeletal ROS (+)   Abdominal (+) + obese,   Peds  Hematology negative hematology ROS (+)   Anesthesia Other Findings Past Medical History: No date: Cancer ([place])     Comment: skin ca No date: Fatigue No date: Incontinence     Comment: STRESS No date: Leg varices 12/2016: Ovarian cancer ([place]) 11/2016: Uterine cancer ([facility])   Reproductive/Obstetrics                             Anesthesia Physical Anesthesia Plan  ASA: II  Anesthesia Plan: General   Post-op Pain Management:    Induction: Intravenous  PONV Risk Score and Plan: 3 and Ondansetron, Dexamethasone, Propofol, Midazolam and Treatment may vary due to age  Airway Management Planned: Oral ETT  Additional Equipment:   Intra-op Plan:   Post-operative Plan: Extubation in OR  Informed Consent: I have reviewed the patients History and Physical, chart, labs and discussed the procedure including the risks, benefits and alternatives for the proposed anesthesia with the patient or authorized  representative who has indicated his/her understanding and acceptance.   Dental advisory given  Plan Discussed with: CRNA and Anesthesiologist  Anesthesia Plan Comments:        Anesthesia Quick Evaluation

## 2017-05-28 NOTE — Transfer of Care (Signed)
Immediate Anesthesia Transfer of Care Note  Patient: Audrey Hart  Procedure(s) Performed: Procedure(s): LAPAROSCOPIC CHOLECYSTECTOMY WITH INTRAOPERATIVE CHOLANGIOGRAM (N/A)  Patient Location: PACU  Anesthesia Type:General  Level of Consciousness: sedated and responds to stimulation  Airway & Oxygen Therapy: Patient Spontanous Breathing and Patient connected to face mask oxygen  Post-op Assessment: Report given to RN and Post -op Vital signs reviewed and stable  Post vital signs: Reviewed and stable  Last Vitals:  Vitals:   05/28/17 0613 05/28/17 0851  BP: (!) 151/70 119/66  Pulse: 61 83  Resp: 18 20  Temp: 36.9 C     Last Pain:  Vitals:   05/28/17 0613  TempSrc: Tympanic         Complications: No apparent anesthesia complications

## 2017-05-28 NOTE — Op Note (Signed)
Preoperative diagnosis: Chronic cholecystitis and cholelithiasis.  Postoperatgnosis: Same.  Operative procedure: Laparoscopic cholecystectomy with intraoperative cholangiograms.  Operating surgeon: Ollen Bowl, M.D.  Anesthesia: Gen. endotracheal.  Estimated blood loss: Less than 5 mL.  Clinical note: This 61 year old woman has developed symptomatically cholelithiasis. She was admitted for elective cholecystectomy.  Operative note: The patient underwent general endotracheal anesthesia without difficulty. The abdomen was prepped with ChloraPrep and draped. In Trendelenburg position a varies needle was placed through a trans-umbilical incision. After assuring intra-abdominal location with a hanging drop test the abdomen was insufflated with CO2 a 10 mmHg pressure. This was increased to 15 mm pressure during the procedure to provide better exposure.  A 10 mm Step port was expanded without incident. Inspection showed no evidence of injury from initial port placement. No adhesions in the lower abdomen status post robotic hysterectomy.  The patient was rolled to the left and placed in reverse Trendelenburg position. An epigastric port was placed, 11 mm XL. 2-5 mm Ports were placed under direct vision. The gallbladder was placed on cephalad traction. Chronic changes were noted. The gallbladder was essentially folded on itself like a taco. The neck of the gallbladder was grasped and the cholecystoduodenal ligament taken down with cautery dissection. The cystic duct was isolated. A Kumar clamp was placed and 20 mL of one half strength Conray 60 was used for cholangiograms. The patient had received Decadron prior to the procedure due to reported shellfish allergy. This showed prompt filling of the common hepatic duct as well as the beginning of the right and left hepatic ducts. There was free flow to the duodenum and no evidence of retained stones. The cystic duct and cystic artery were doubly clipped and  divided. The gallbladder was removed liver bed making use of hook cautery dissection. Bleeding from a accessory branch of the artery and the gallbladder bed was controlled with additional clip application. The gallbladder is placed into an Endo Catch bag and then delivered to the umbilical port site. This was then removed without incident. After reestablishing pneumoperitoneum inspection from the epigastric site showed no evidence of injury from initial port placement. The right upper quadrant was irrigated with lactated Ringer solution. No bleeding was noted. The abdomen was then desufflated and ports removed under direct vision.  Skin incisions were closed with 4-0 Vicryl septic sutures. Benzoin, Steri-Strips, Telfa and Tegaderm dressings were applied.  The patient tolerated the procedure well and was taken to recovery room in stable condition.

## 2017-05-28 NOTE — Anesthesia Procedure Notes (Signed)
Procedure Name: Intubation Performed by: Lance Muss Pre-anesthesia Checklist: Patient identified, Patient being monitored, Timeout performed, Emergency Drugs available and Suction available Patient Re-evaluated:Patient Re-evaluated prior to inductionOxygen Delivery Method: Circle system utilized Preoxygenation: Pre-oxygenation with 100% oxygen Intubation Type: IV induction Ventilation: Mask ventilation without difficulty, Oral airway inserted - appropriate to patient size and Two handed mask ventilation required Laryngoscope Size: Mac and 3 Grade View: Grade I Tube type: Oral Tube size: 7.0 mm Number of attempts: 1 Airway Equipment and Method: Stylet Placement Confirmation: ETT inserted through vocal cords under direct vision,  positive ETCO2 and breath sounds checked- equal and bilateral Secured at: 22 cm Tube secured with: Tape Dental Injury: Teeth and Oropharynx as per pre-operative assessment

## 2017-05-28 NOTE — Anesthesia Post-op Follow-up Note (Cosign Needed)
Anesthesia QCDR form completed.        

## 2017-05-28 NOTE — Discharge Instructions (Signed)

## 2017-05-28 NOTE — OR Nursing (Signed)
Ok to d/c to home per Dr. Bary Castilla tele to Adah Salvage, RN umbil drsg changed prior to d/c by Donnal Moat

## 2017-05-29 ENCOUNTER — Encounter: Payer: Self-pay | Admitting: General Surgery

## 2017-05-31 ENCOUNTER — Telehealth: Payer: Self-pay | Admitting: *Deleted

## 2017-05-31 LAB — SURGICAL PATHOLOGY

## 2017-05-31 NOTE — Telephone Encounter (Signed)
Patient had gallbladder surgery on 05/28/17 and took all of the bandages off due to causing a lot of pressure. She did put a band aid on over the incision and its bleeding and oozing. She is concern that just the band aid is not enough and may need more.

## 2017-05-31 NOTE — Telephone Encounter (Signed)
Spoke with patient and she states that she has a small amount of light red drainage on the Band-Aid she placed over her belly button area. She was concerned if this was alright. She denies any redness, pain, or bad smell. I assured her the the Band-Aid was fine but just to look out for any increase in bleeding or any signs of infection. She will follow up as scheduled on 06/08/17.

## 2017-06-08 ENCOUNTER — Ambulatory Visit (INDEPENDENT_AMBULATORY_CARE_PROVIDER_SITE_OTHER): Payer: BLUE CROSS/BLUE SHIELD | Admitting: General Surgery

## 2017-06-08 VITALS — BP 128/64 | HR 74 | Resp 12 | Ht 64.0 in | Wt 220.0 lb

## 2017-06-08 DIAGNOSIS — K8020 Calculus of gallbladder without cholecystitis without obstruction: Secondary | ICD-10-CM

## 2017-06-08 NOTE — Patient Instructions (Addendum)
Reintroduce foods that may have causedd some discomfort previously.  Apply Qtip with peroxide on umbilical incision.  Call the office with any concerns.

## 2017-06-08 NOTE — Progress Notes (Signed)
Patient ID: Audrey Hart, female   DOB: 1956-10-13, 61 y.o.   MRN: [id number]  Chief Complaint  Patient presents with  . Routine Post Op    Cholecystectomy    HPI Audrey Hart is a 61 y.o. female is here today for her post op cholecystectomy done 05/28/17. Patient doing well, moving bowels daily.   The patient had some drainage from the umbilicus had been unaware that there was a roll of Telfa gauze placed into the umbilicus. Since this was removed she has noticed some drainage but general improvement.  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 HPI  Past Medical History:  Diagnosis Date  . Cancer ([place])    skin ca  . Fatigue   . Incontinence    STRESS  . Leg varices   . Ovarian cancer ([place]) 12/2016  . Uterine cancer ([place]) 11/2016    Past Surgical History:  Procedure Laterality Date  . ABDOMINAL HYSTERECTOMY  01/06/2017   total  . CHOLECYSTECTOMY N/A 05/28/2017   Procedure: LAPAROSCOPIC CHOLECYSTECTOMY WITH INTRAOPERATIVE CHOLANGIOGRAM;  Surgeon: Robert Bellow, MD;  Location: [facility] ORS;  Service: General;  Laterality: N/A;  . OVARIAN CYST REMOVAL  1978  . ROBOTIC ASSISTED TOTAL HYSTERECTOMY WITH BILATERAL SALPINGO OOPHERECTOMY Bilateral 01/06/2017   Procedure: ROBOTIC ASSISTED TOTAL HYSTERECTOMY WITH BILATERAL SALPINGO OOPHORECTOMY;  Surgeon: Gillis Ends, MD;  Location: [facility] ORS;  Service: Gynecology;  Laterality: Bilateral;  . ROBOTIC PELVIC AND PARA-AORTIC LYMPH NODE DISSECTION N/A 01/06/2017   Procedure: ROBOTIC PELVIC AND PARA-AORTIC LYMPH NODE DISSECTION;  Surgeon: Gillis Ends, MD;  Location: [facility] ORS;  Service: Gynecology;  Laterality: N/A;  . SENTINEL NODE BIOPSY N/A 01/06/2017   Procedure: SENTINEL NODE BIOPSY;  Surgeon: Gillis Ends, MD;  Location: [facility] ORS;  Service: Gynecology;  Laterality: N/A;    Family History  Problem Relation Age of Onset  . Heart disease Brother   . Clotting disorder Brother 31        blood clots associated with inactivity  . Breast cancer Neg Hx     Social History Social History  Substance Use Topics  . Smoking status: Former Smoker    Packs/day: 1.00    Years: 15.00    Types: Cigarettes    Quit date: 01/04/1989  . Smokeless tobacco: Never Used  . Alcohol use 1.2 oz/week    2 Standard drinks or equivalent per week     Comment: occasional    Allergies  Allergen Reactions  . Cantaloupe (Diagnostic) Hives  . Shrimp [Shellfish Allergy] Hives and Nausea And Vomiting    Current Outpatient Prescriptions  Medication Sig Dispense Refill  . naproxen sodium (ANAPROX) 220 MG tablet Take 220 mg by mouth at bedtime as needed (for pain/tired or sore muscles.).     No current facility-administered medications for this visit.     Review of Systems Review of Systems  Constitutional: Negative.   Respiratory: Negative.   Cardiovascular: Negative.     Blood pressure 128/64, pulse 74, resp. rate 12, height 5\' 4"  (1.626 m), weight 220 lb (99.8 kg).  Physical Exam Physical Exam  Constitutional: She is oriented to person, place, and time. She appears well-developed and well-nourished.  Cardiovascular: Normal rate, regular rhythm and normal heart sounds.   Pulmonary/Chest: Effort normal and breath sounds normal.  Abdominal: Soft. Bowel sounds are normal.  Well healed incision   Neurological: She is alert and oriented to person, place, and time.  Skin: Skin is warm and  dry.    Data Reviewed 05/28/2017 pathology:  DIAGNOSIS:  A. GALLBLADDER; CHOLECYSTECTOMY:  - CHRONIC CHOLECYSTITIS WITH CHOLELITHIASIS.  - NEGATIVE FOR MALIGNANCY,   Assessment    Doing well post cholecystectomy.  Umbilical drainage secondary to deep incision.    Plan    Local wound care for the umbilicus reviewed.  Apply Qtip with peroxide down near umbilical incision.  Reintroduce foods that may have caused some discomfort previously. (Sees patient has tolerated bacon without  difficulty)  The patient will report if she has ongoing difficulties with the umbilical incision.    HPI, Physical Exam, Assessment and Plan have been scribed under the direction and in the presence of Hervey Ard, MD.  Verlene Mayer, CMA  I have completed the exam and reviewed the above documentation for accuracy and completeness.  I agree with the above.  Haematologist has been used and any errors in dictation or transcription are unintentional.  Hervey Ard, M.D., F.A.C.S.   Robert Bellow 06/08/2017, 8:48 PM

## 2017-07-30 ENCOUNTER — Other Ambulatory Visit: Payer: BLUE CROSS/BLUE SHIELD

## 2017-08-02 ENCOUNTER — Other Ambulatory Visit: Payer: BLUE CROSS/BLUE SHIELD

## 2017-08-04 ENCOUNTER — Ambulatory Visit: Payer: BLUE CROSS/BLUE SHIELD

## 2017-08-09 ENCOUNTER — Encounter: Payer: Self-pay | Admitting: Obstetrics and Gynecology

## 2017-08-17 ENCOUNTER — Inpatient Hospital Stay: Payer: BLUE CROSS/BLUE SHIELD

## 2017-08-18 ENCOUNTER — Inpatient Hospital Stay: Payer: BLUE CROSS/BLUE SHIELD

## 2017-08-18 ENCOUNTER — Inpatient Hospital Stay: Payer: BLUE CROSS/BLUE SHIELD | Attending: Obstetrics and Gynecology | Admitting: Obstetrics and Gynecology

## 2017-08-18 VITALS — BP 129/78 | HR 90 | Temp 98.4°F | Resp 18 | Ht 64.0 in | Wt 218.5 lb

## 2017-08-18 DIAGNOSIS — Z90710 Acquired absence of both cervix and uterus: Secondary | ICD-10-CM | POA: Diagnosis not present

## 2017-08-18 DIAGNOSIS — Z90722 Acquired absence of ovaries, bilateral: Secondary | ICD-10-CM | POA: Diagnosis not present

## 2017-08-18 DIAGNOSIS — E669 Obesity, unspecified: Secondary | ICD-10-CM | POA: Diagnosis not present

## 2017-08-18 DIAGNOSIS — Z8543 Personal history of malignant neoplasm of ovary: Secondary | ICD-10-CM | POA: Diagnosis not present

## 2017-08-18 DIAGNOSIS — Z8542 Personal history of malignant neoplasm of other parts of uterus: Secondary | ICD-10-CM | POA: Diagnosis not present

## 2017-08-18 DIAGNOSIS — K8020 Calculus of gallbladder without cholecystitis without obstruction: Secondary | ICD-10-CM | POA: Diagnosis not present

## 2017-08-18 DIAGNOSIS — Z6837 Body mass index (BMI) 37.0-37.9, adult: Secondary | ICD-10-CM | POA: Diagnosis not present

## 2017-08-18 DIAGNOSIS — Z87891 Personal history of nicotine dependence: Secondary | ICD-10-CM

## 2017-08-18 DIAGNOSIS — C569 Malignant neoplasm of unspecified ovary: Secondary | ICD-10-CM

## 2017-08-18 DIAGNOSIS — Z791 Long term (current) use of non-steroidal anti-inflammatories (NSAID): Secondary | ICD-10-CM | POA: Diagnosis not present

## 2017-08-18 NOTE — Patient Instructions (Signed)
See Dr. Leonides Schanz in 3 months and Dr. Theora Gianotti in 6 months

## 2017-08-18 NOTE — Progress Notes (Signed)
Pt has no gyn concerns but she has skin issues right around her breast that dermatology is workin g on. She has two creams and prednisone and dlifucan orally.

## 2017-08-18 NOTE — Progress Notes (Signed)
Gynecologic Oncology Interval Visit   Gyn Providers: Dr. Ouida Sills Dr. Leonides Schanz  PCP: Paula Compton, NP   Chief Concern: continued surveillance for grade 1 endometrial cancer  Subjective:  Audrey Hart is a 61 y.o. female s/p oophorectomy for ovarian cysts in the 1978 who is seen in consultation from Dr. Ouida Sills for grade 1 endometrial cancer. 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    She underwent cholecystectomy for symptomatic cholelithiasis on 05/28/2017 with benign pathology.   Additional uterine pathology: MLH-1: intact nuclear staining (normal phenotype)  MSH-2: intact nuclear staining (normal phenotype)  MSH-6: intact nuclear staining (normal phenotype)  PMS-2: intact nuclear staining (normal phenotype)    She has a bad yeast infection in the lower abdomen and below the breasts. She is on antifungal topical and oral agents as well as steroids.   CA125 pending  Gynecologic Oncology History Audrey Hart is a pleasant female s/p oophorectomy for ovarian cysts in the 1978 who is seen in consultation from Dr. Ouida Sills for grade 1 endometrial cancer. See prior notes for complete details.   On 01/06/2017 she underwent robotic TLH, BSO, sentinel node injection, mapping, and bilateral pelvic sentinel lymph node biopsy. She was seen yesterday by Dr. Leonides Schanz for possible prolapse and she discussed pessary placement as well as pelvic PT with the patient. She also complains of indigestion. Her preop CT demonstrated cholelithiasis.   Pathology: DIAGNOSIS:  A. SENTINEL LYMPH NODE, RIGHT EXTERNAL ILIAC VEIN; EXCISION:  - NO TUMOR SEEN IN ONE LYMPH NODE (0/1).   B. PARATUBAL; BIOPSY:  - NO TUMOR SEEN.   C. PELVIC SIDEWALL; BIOPSY:  - NO TUMOR SEEN.   D. SENTINEL LYMPH NODE, LEFT EXTERNAL ILIAC VEIN; EXCISION:  - NO TUMOR SEEN (LYMPH NODE FRAGMENTS).   E. UTERUS WITH CERVIX, BILATERAL  FALLOPIAN TUBES AND OVARIES;  HYSTERECTOMY WITH BILATERAL SALPINGO-OOPHORECTOMY:  - NON-INVASIVE ENDOMETRIOID ADENOCARCINOMA, FIGO GRADE I (SEE COMMENT).  - RIGHT OVARY INVOLVED BY LOW GRADE ADENOCARCINOMA (SEE COMMENT).  - NABOTHIAN CYSTS AND FOCAL CHRONIC CERVICITIS.  - UNREMARKABLE FALLOPIAN TUBES.  - CORTICAL INCLUSION CYSTS AND SIMPLE CYST OF LEFT OVARY.   Cytology:  DIAGNOSIS:  A. PELVIC WASHINGS:  - NEGATIVE FOR MALIGNANCY.  Recut on 2 blocks (A15 is still at [facility]) and did not appreciate any ovarian surface involvement.   [facility] Consultation Pathology Review: "Thank you for sending this diagnostically challenging case for consultation. Five representative slides are received and reviewed. Sections of the uterus demonstrate endometrial adenocarcinoma, FIGO grade 1 endometrioid type, without evidence of myometrial invasion or lymphovascular invasion. Sections of the right ovary demonstrate a neoplastic process with similar architecture and cytomorphology, also felt to be consistent with FIGO grade 1 endometrioid adenocarcinoma. There is no endometriosis noted in the ovary to suggest a synchronous primary. However, the low grade of the endometrial tumor, along with the lack of myometrial invasion or lymphovascular invasion, would argue against metastasis. The fallopian tube demonstrates no evidence of malignancy. Unfortunately in cases such as this, the distinction between metastatic disease and synchronous primaries is not possible. This case was seen in consultation with Drs. Delmer Islam and Kerby Nora, whose opinions are reflected in the above interpretation."  CT scan 01/01/2017 IMPRESSION: 1. Limited study without IV contrast. No intrahepatic biliary ductal dilatation. At least 2 calcified gallstones are noted within contracted gallbladder the largest measures 2 cm. 2. No small bowel or colonic obstruction. 3. No pericecal inflammation.  Normal  appendix. 4. No ascites or free  abdominal air. 5. Normal size uterus. There is isoechoic nodular filling defect within left fundal endometrial cavity measures 1.8 cm consistent with known history of endometrial malignancy. 6. No adnexal mass.  No pelvic sidewall adenopathy. 7. Mild degenerative changes thoracolumbar spine.  CXR 01/04/2017 IMPRESSION: No acute cardiopulmonary disease.  Her case was discussed and the recommendation was for no further therapy for either cancer.     She has been NED since.  Genetic testing needs to be ordered. Plan to order today.   Routine health care screening:  Mammogram 2016 Colonoscopy 2015   Problem List: Patient Active Problem List   Diagnosis Date Noted  . History of ovarian cancer 08/18/2017  . Gallstones 02/03/2017  . Status post laparoscopic hysterectomy 01/06/2017  . Endometrial cancer ([place]) 12/30/2016  . Menopause 09/16/2015  . Fatigue 09/16/2015  . Stress incontinence 09/16/2015  . 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 Cancer ([place])    skin ca  . Fatigue   . Incontinence    STRESS  . Leg varices   . Ovarian cancer ([place]) 12/2016  . Uterine cancer ([place]) 11/2016    Past Surgical History: Past Surgical History:  Procedure Laterality Date  . ABDOMINAL HYSTERECTOMY  01/06/2017   total  . CHOLECYSTECTOMY N/A 05/28/2017   Procedure: LAPAROSCOPIC CHOLECYSTECTOMY WITH INTRAOPERATIVE CHOLANGIOGRAM;  Surgeon: Robert Bellow, MD;  Location: [facility] ORS;  Service: General;  Laterality: N/A;  . OVARIAN CYST REMOVAL  1978  . ROBOTIC ASSISTED TOTAL HYSTERECTOMY WITH BILATERAL SALPINGO OOPHERECTOMY Bilateral 01/06/2017   Procedure: ROBOTIC ASSISTED TOTAL HYSTERECTOMY WITH BILATERAL SALPINGO OOPHORECTOMY;  Surgeon: Gillis Ends, MD;  Location: [facility] ORS;  Service: Gynecology;  Laterality: Bilateral;  . ROBOTIC PELVIC AND PARA-AORTIC LYMPH NODE DISSECTION N/A 01/06/2017   Procedure: ROBOTIC PELVIC AND  PARA-AORTIC LYMPH NODE DISSECTION;  Surgeon: Gillis Ends, MD;  Location: [facility] ORS;  Service: Gynecology;  Laterality: N/A;  . SENTINEL NODE BIOPSY N/A 01/06/2017   Procedure: SENTINEL NODE BIOPSY;  Surgeon: Gillis Ends, MD;  Location: [facility] ORS;  Service: Gynecology;  Laterality: N/A;    Past Gynecologic History:  Menarche: 12 Last Menstrual Period: Menopause 40's History of Abnormal pap: no, no abnormalities Last pap: 11/2016 [facility].     OB History:  OB History  Gravida Para Term Preterm AB Living  1 1          SAB TAB Ectopic Multiple Live Births               # Outcome Date GA Lbr Len/2nd Weight Sex Delivery Anes PTL Lv  1 Para             Obstetric Comments  Menstrual age: teens    Age 1st Pregnancy: 16    Family History: Family History  Problem Relation Age of Onset  . Heart disease Brother   . Clotting disorder Brother 30       blood clots associated with inactivity  . Breast cancer Neg Hx     Social History: Social History   Social History  . Marital status: Married    Spouse name: N/A  . Number of children: N/A  . Years of education: N/A   Occupational History  . Massage therapy    Social History Main Topics  . Smoking status: Former Smoker    Packs/day: 1.00    Years: 15.00    Types: Cigarettes  Quit date: 01/04/1989  . Smokeless tobacco: Never Used  . Alcohol use 1.2 oz/week    2 Standard drinks or equivalent per week     Comment: occasional  . Drug use: No  . Sexual activity: Yes   Other Topics Concern  . Not on file   Social History Narrative  . No narrative on file    Allergies: Allergies  Allergen Reactions  . Cantaloupe (Diagnostic) Hives  . Shrimp [Shellfish Allergy] Hives and Nausea And Vomiting    Current Medications: Current Outpatient Prescriptions  Medication Sig Dispense Refill  . fluconazole (DIFLUCAN) 100 MG tablet Take 100 mg by mouth daily.    Marland Kitchen ketoconazole (NIZORAL) 2 % cream Apply 1  application topically 2 (two) times daily as needed for irritation.    . naproxen sodium (ANAPROX) 220 MG tablet Take 220 mg by mouth at bedtime as needed (for pain/tired or sore muscles.).    Marland Kitchen predniSONE (STERAPRED UNI-PAK 21 TAB) 10 MG (21) TBPK tablet Take by mouth daily as needed.    . triamcinolone cream (KENALOG) 0.1 % Apply 1 application topically 2 (two) times daily as needed.     No current facility-administered medications for this visit.     Review of Systems General: no complaints  HEENT: no complaints  Lungs: no complaints  Cardiac: no complaints  GI: no complaints  GU: no complaints  Musculoskeletal: no complaints  Extremities: no complaints  Skin: as per interval history  Neuro: no complaints  Endocrine: no complaints  Psych: no complaints       Objective:  Physical Examination:  BP 129/78   Pulse 90   Temp 98.4 F (36.9 C) (Tympanic)   Resp 18   Ht '5\' 4"'  (1.626 m)   Wt 218 lb 8 oz (99.1 kg)   BMI 37.51 kg/m    ECOG Performance Status: 0 - Asymptomatic  General appearance: alert, cooperative and appears stated age HEENT:PERRLA, extra ocular movement intact and sclera clear, anicteric Lymph node survey: non-palpable inguinal nodes Abdomen: soft, non-tender, without masses or organomegaly, no hernias and well healed incisions Extremities: extremities normal, atraumatic, no cyanosis or edema Skin exam - erythematous diffuse rash below breasts and suprapubic regions.  Neurological exam reveals alert, oriented, normal speech, no focal findings or movement disorder noted.  Pelvic: exam chaperoned by nurse;  Vulva: normal appearing vulva with no masses, tenderness or lesions; Vagina: normal vagina healed cuff, no lesions; Adnexa: no masses; Uterus/Cervix: surgically absent; Rectal: not indicated  Split speculum at initial postop visit: mild to moderate cystocele; moderate rectocele; no evidence of enterocele. The vaginal cuff seems supported.   Lab  Review n/a  Radiologic Imaging: n/a     Assessment:  Audrey Hart is a 61 y.o. 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  MMR testing negative.   Symptomatic cholelithiasis.   Pelvic organ prolapse.   Medical co-morbidities complicating care: obesity.  Plan:   Problem List Items Addressed This Visit      Other   History of ovarian cancer    Other Visit Diagnoses    History of endometrial cancer    -  Primary      We discussed options management for both of her cancers. No further therapy is required for either ovarian or endometrial cancer. Genetic testing and CA125 today.   We will continue to alternate visits with Dr. Leonides Schanz for continued surveillance in  3 months.  Then she can return to [place] clinic in 6 months.  If NED I have recommended continued close follow up with exams, including pelvic exams, and CA125 levels every 3 months for 2 years, every 4 months for the next year, then every 6 months until 5 years and then annually thereafter. We will order CA125 at her next visit.   At her postop visit we discussed the following: Imaging and laboratory assessment is based on clinical indication. Patient education for obesity, lifestyle, exercise, nutrition, smoking cessation, sexual health, vaginal lubricants. We again discussed her weight and need for weight loss, stressed good nutrition, and exercise.  She was previously offered referral to the CARE program and provided another pamphlet today.   The patient's diagnosis, an outline of the further diagnostic and laboratory studies which will be required, the recommendation, and alternatives were discussed.  All questions were answered to the patient's satisfaction.  A total of 60 minutes were spent with the patient/family today; 50% was spent in education, counseling and coordination of care for endometrial cancer and ovarian cancer.    Gillis Ends, MD    CC:  Dr. Laverta Baltimore   Dr. Vikki Ports Ward  PCPs:  Paula Compton, NP   and  Glean Hess, MD [address] [place], [place] [address] [phone]

## 2017-08-18 NOTE — Progress Notes (Signed)
  Oncology Nurse Navigator Documentation Chaperoned pelvic exam. Blood sample obtained for genetic testing. Navigator Location: [facility] (08/18/17 1400)   )Navigator Encounter Type: Follow-up Appt (08/18/17 1400)                     Patient Visit Type: GynOnc (08/18/17 1400)                              Time Spent with Patient: 15 (08/18/17 1400)

## 2017-08-19 LAB — CA 125: Cancer Antigen (CA) 125: 6.8 U/mL (ref 0.0–38.1)

## 2017-08-25 ENCOUNTER — Ambulatory Visit: Payer: BLUE CROSS/BLUE SHIELD

## 2017-08-25 ENCOUNTER — Other Ambulatory Visit: Payer: BLUE CROSS/BLUE SHIELD

## 2017-09-20 HISTORY — PX: OTHER SURGICAL HISTORY: SHX169

## 2017-11-17 ENCOUNTER — Telehealth: Payer: Self-pay | Admitting: Obstetrics and Gynecology

## 2017-11-17 ENCOUNTER — Telehealth: Payer: Self-pay

## 2017-11-17 NOTE — Telephone Encounter (Signed)
We reviewed genetic testing. 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 I have asked Audrey Clonts, RN, to contact Audrey Hart and arrange for genetic counseling with [facility] Genetic counseling.  Gillis Ends, MD

## 2017-11-17 NOTE — Telephone Encounter (Signed)
  Oncology Nurse Navigator Documentation Voicemail left with Ms. Schlick to return call regarding genetic testing results. Once notified will place referral for genetic counselor. Navigator Location: [facility] (11/17/17 1700)   )Navigator Encounter Type: Telephone (11/17/17 1700) Telephone: (Genetic testing results) (11/17/17 1700)                                                  Time Spent with Patient: 15 (11/17/17 1700)

## 2017-11-20 DIAGNOSIS — Z1371 Encounter for nonprocreative screening for genetic disease carrier status: Secondary | ICD-10-CM

## 2017-11-20 HISTORY — DX: Encounter for nonprocreative screening for genetic disease carrier status: Z13.71

## 2017-11-22 ENCOUNTER — Telehealth: Payer: Self-pay

## 2017-11-22 ENCOUNTER — Telehealth: Payer: Self-pay | Admitting: Genetic Counselor

## 2017-11-22 NOTE — Telephone Encounter (Signed)
Cancer Genetics            Telegenetics Initial Visit   Patient Name: Audrey Hart Patient DOB: [birth year] Patient Age: 61 y.o. Phone Call Date: 11/22/2017  Referring Provider: Angeles A. Theora Gianotti, MD  Reason for Visit: Discuss results of genetic testing   History of Present Illness: Ms. Audrey Hart, a 61 y.o. female, was referred for genetic counseling to discuss results of genetic testing she had coordinated by Dr. Theora Gianotti a couple of months ago. This was a telegenetics visit via phone.  Audrey Hart was diagnosed with endometrial cancer at the age of 39 and had surgery. 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  Dr. Theora Gianotti coordinated genetic testing for her. Audrey Hart genetic analysis included the 47 genes on [facility]'s Common Cancers panel (APC, ATM, AXIN2, BARD1, BMPR1A, BRCA1, BRCA2, BRIP1, CDH1, CDK4, CDKN2A, CHEK2, CTNNA1, DICER1, EPCAM, GREM1, HOXB13, KIT, MEN1, MLH1, MSH2, MSH3, MSH6, MUTYH, NBN, NF1, NTHL1, PALB2, PDGFRA, PMS2, POLD1, POLE, PTEN, RAD50, RAD51C, RAD51D, SDHA, SDHB, SDHC, SDHD, SMAD4, SMARCA4, STK11, TP53, TSC1, TSC2, VHL).  Test Results: Testing did not reveal any pathogenic mutation in any of these genes.  A copy of the genetic test report will be scanned into Epic under the media tab.  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 This is still considered a normal result. While at this time, it is unknown if this finding is associated with increased cancer risk, the majority of these variants get reclassified to be inconsequential. We emphasized that medical management should not be based on this finding. With time, we suspect the lab will determine the significance, if any. If [facility] does reclassify it, they will let the ordering provider, Dr. Theora Gianotti know. It is important that Audrey Hart stay in touch with the  ordering provider periodically and keep the address and phone number up to date.  Since the current test is not perfect, it is possible that there may be a gene mutation that current testing cannot detect, but that chance is small. It is possible that a different genetic factor, which has not yet been discovered or is not on this panel, is responsible for the cancer diagnoses in the family. Again, the likelihood of this is low. No additional testing is recommended at this time for Audrey Hart.  Family History: Audrey Hart does not have a family history of cancer. She has one daughter (age 79). She has 2 brothers (ages 32 and 40). Her mother died at 44, cancer-free. She had a brother. Her father died in an accident at age 78. He had 3 sisters and 2 brothers. There are no cancers in her 4 grandparents who died at ages 40s-90s.  Audrey Hart ancestry is Caucasian - NOS. There is no known Jewish ancestry and no consanguinity.  Cancer Screening: These results suggest that Audrey Hart's cancer was most likely not due to an inherited predisposition. Most cancers happen by chance and this test, along with details of her family history, suggests that her cancer falls into this category. We discussed continuing to follow the cancer screening guidelines provided by her physician.   Family Members: Family members are at some increased risk of developing cancer, over the general population risk, simply due to the family history. Women are recommended to have a yearly mammogram beginning at age 61, a  yearly clinical breast exam, a yearly gynecologic exam and perform monthly breast self-exams. Colon cancer screening is recommended to begin by age 7 in both men and women, unless there is a family history of colon cancer or colon polyps or an individual has a personal history to warrant initiating screening at a younger age.  Any relative who had cancer at a young age or had a particularly rare cancer may also  wish to pursue genetic testing. Genetic counselors can be located in other cities, by visiting the website of the [employer] ([employer]) and Field seismologist for a Dietitian by zip code.   Family members are not recommended to get tested for the above VUS outside of a research protocol as this finding has no implications for their medical management.     Lastly, cancer genetics is a rapidly advancing field and it is possible that new genetic tests will be appropriate for Audrey Hart in the future. We encourage Audrey Hart to remain in contact with Genetics on an annual basis so her personal and family histories can be updated.    Dr. Grayland Ormond was available for questions concerning this case. Total time spent by counseling by phone was approximately 20 minutes.    Steele Berg, MS, [place] Certified Genetic Counselor phone: [phone]

## 2017-11-22 NOTE — Telephone Encounter (Signed)
Audrey Hart was referred for genetic counseling by Dr. Theora Gianotti due to a Variant of Uncertain Significance in RAD50. I briefly spoke to her to schedule and she opted to have an appointment later this afternoon.   Steele Berg, [place], [place] Genetic Counselor Phone: [phone]

## 2017-11-22 NOTE — Telephone Encounter (Signed)
Audrey Hart returned call. Went over genetic testing results including a VUS in the RAD50 gene. I have made referral to genetic counselor, Audrey Hart, as requested by Dr. Theora Gianotti. Audrey Hart is aware that she will be contacted regarding this appointment.   Oncology Nurse Navigator Documentation  Navigator Location: [facility] (11/22/17 1200)   )Navigator Encounter Type: Telephone;Diagnostic Results (11/22/17 1200) Telephone: Incoming Call;Diagnostic Results (11/22/17 1200)                                                  Time Spent with Patient: 15 (11/22/17 1200)

## 2017-12-03 ENCOUNTER — Encounter: Payer: Self-pay | Admitting: General Surgery

## 2018-01-30 ENCOUNTER — Encounter (INDEPENDENT_AMBULATORY_CARE_PROVIDER_SITE_OTHER): Payer: Self-pay

## 2018-02-16 ENCOUNTER — Inpatient Hospital Stay: Payer: BLUE CROSS/BLUE SHIELD

## 2018-02-16 ENCOUNTER — Other Ambulatory Visit: Payer: Self-pay

## 2018-02-16 ENCOUNTER — Inpatient Hospital Stay: Payer: BLUE CROSS/BLUE SHIELD | Attending: Obstetrics and Gynecology | Admitting: Obstetrics and Gynecology

## 2018-02-16 VITALS — BP 131/83 | HR 82 | Temp 98.6°F | Resp 18 | Ht 64.0 in | Wt 222.9 lb

## 2018-02-16 DIAGNOSIS — C541 Malignant neoplasm of endometrium: Secondary | ICD-10-CM

## 2018-02-16 DIAGNOSIS — Z90722 Acquired absence of ovaries, bilateral: Secondary | ICD-10-CM | POA: Diagnosis not present

## 2018-02-16 DIAGNOSIS — Z90710 Acquired absence of both cervix and uterus: Secondary | ICD-10-CM

## 2018-02-16 LAB — TSH: TSH: 1.525 u[IU]/mL (ref 0.350–4.500)

## 2018-02-16 LAB — CBC WITH DIFFERENTIAL/PLATELET
BASOS ABS: 0.1 10*3/uL (ref 0–0.1)
Basophils Relative: 1 %
EOS ABS: 0.1 10*3/uL (ref 0–0.7)
EOS PCT: 2 %
HCT: 41 % (ref 35.0–47.0)
Hemoglobin: 13.9 g/dL (ref 12.0–16.0)
Lymphocytes Relative: 37 %
Lymphs Abs: 2.5 10*3/uL (ref 1.0–3.6)
MCH: 30.7 pg (ref 26.0–34.0)
MCHC: 34 g/dL (ref 32.0–36.0)
MCV: 90.5 fL (ref 80.0–100.0)
MONO ABS: 0.4 10*3/uL (ref 0.2–0.9)
Monocytes Relative: 6 %
Neutro Abs: 3.7 10*3/uL (ref 1.4–6.5)
Neutrophils Relative %: 54 %
PLATELETS: 307 10*3/uL (ref 150–440)
RBC: 4.53 MIL/uL (ref 3.80–5.20)
RDW: 14.1 % (ref 11.5–14.5)
WBC: 6.8 10*3/uL (ref 3.6–11.0)

## 2018-02-16 MED ORDER — VENLAFAXINE HCL ER 75 MG PO CP24: 75.0000 mg | ORAL_CAPSULE | Freq: Every day | ORAL | 3 refills | Status: DC

## 2018-02-16 NOTE — Patient Instructions (Signed)
Venlafaxine tablets What is this medicine? VENLAFAXINE (VEN la fax een) is used to treat depression, anxiety and panic disorder. This medicine may be used for other purposes; ask your health care provider or pharmacist if you have questions. COMMON BRAND NAME(S): Effexor What should I tell my health care provider before I take this medicine? They need to know if you have any of these conditions: -bleeding disorders -glaucoma -heart disease -high blood pressure -high cholesterol -kidney disease -liver disease -low levels of sodium in the blood -mania or bipolar disorder -seizures -suicidal thoughts, plans, or attempt; a previous suicide attempt by you or a family -take medicines that treat or prevent blood clots -thyroid disease -an unusual or allergic reaction to venlafaxine, desvenlafaxine, other medicines, foods, dyes, or preservatives -pregnant or trying to get pregnant -breast-feeding How should I use this medicine? Take this medicine by mouth with a glass of water. Follow the directions on the prescription label. Take it with food. Take your medicine at regular intervals. Do not take your medicine more often than directed. Do not stop taking this medicine suddenly except upon the advice of your doctor. Stopping this medicine too quickly may cause serious side effects or your condition may worsen. A special MedGuide will be given to you by the pharmacist with each prescription and refill. Be sure to read this information carefully each time. Talk to your pediatrician regarding the use of this medicine in children. Special care may be needed. Overdosage: If you think you have taken too much of this medicine contact a poison control center or emergency room at once. NOTE: This medicine is only for you. Do not share this medicine with others. What if I miss a dose? If you miss a dose, take it as soon as you can. If it is almost time for your next dose, take only that dose. Do not take  double or extra doses. What may interact with this medicine? Do not take this medicine with any of the following medications: -certain medicines for fungal infections like fluconazole, itraconazole, ketoconazole, posaconazole, voriconazole -cisapride -desvenlafaxine -dofetilide -dronedarone -duloxetine -levomilnacipran -linezolid -MAOIs like Carbex, Eldepryl, Marplan, Nardil, and Parnate -methylene blue (injected into a vein) -milnacipran -pimozide -thioridazine -ziprasidone This medicine may also interact with the following medications: -amphetamines -aspirin and aspirin-like medicines -certain medicines for depression, anxiety, or psychotic disturbances -certain medicines for migraine headaches like almotriptan, eletriptan, frovatriptan, naratriptan, rizatriptan, sumatriptan, zolmitriptan -certain medicines for sleep -certain medicines that treat or prevent blood clots like dalteparin, enoxaparin, warfarin -cimetidine -clozapine -diuretics -fentanyl -furazolidone -indinavir -isoniazid -lithium -metoprolol -NSAIDS, medicines for pain and inflammation, like ibuprofen or naproxen -other medicines that prolong the QT interval (cause an abnormal heart rhythm) -procarbazine -rasagiline -supplements like St. John's wort, kava kava, valerian -tramadol -tryptophan This list may not describe all possible interactions. Give your health care provider a list of all the medicines, herbs, non-prescription drugs, or dietary supplements you use. Also tell them if you smoke, drink alcohol, or use illegal drugs. Some items may interact with your medicine. What should I watch for while using this medicine? Tell your doctor if your symptoms do not get better or if they get worse. Visit your doctor or health care professional for regular checks on your progress. Because it may take several weeks to see the full effects of this medicine, it is important to continue your treatment as prescribed  by your doctor. Patients and their families should watch out for new or worsening thoughts of suicide or depression. Also   watch out for sudden changes in feelings such as feeling anxious, agitated, panicky, irritable, hostile, aggressive, impulsive, severely restless, overly excited and hyperactive, or not being able to sleep. If this happens, especially at the beginning of treatment or after a change in dose, call your health care professional. This medicine can cause an increase in blood pressure. Check with your doctor for instructions on monitoring your blood pressure while taking this medicine. You may get drowsy or dizzy. Do not drive, use machinery, or do anything that needs mental alertness until you know how this medicine affects you. Do not stand or sit up quickly, especially if you are an older patient. This reduces the risk of dizzy or fainting spells. Alcohol may interfere with the effect of this medicine. Avoid alcoholic drinks. Your mouth may get dry. Chewing sugarless gum, sucking hard candy and drinking plenty of water will help. Contact your doctor if the problem does not go away or is severe. What side effects may I notice from receiving this medicine? Side effects that you should report to your doctor or health care professional as soon as possible: -allergic reactions like skin rash, itching or hives, swelling of the face, lips, or tongue -anxious -breathing problems -confusion -changes in vision -chest pain -confusion -elevated mood, decreased need for sleep, racing thoughts, impulsive behavior -eye pain -fast, irregular heartbeat -feeling faint or lightheaded, falls -feeling agitated, angry, or irritable -hallucination, loss of contact with reality -high blood pressure -loss of balance or coordination -palpitations -redness, blistering, peeling or loosening of the skin, including inside the mouth -restlessness, pacing, inability to keep still -seizures -stiff  muscles -suicidal thoughts or other mood changes -trouble passing urine or change in the amount of urine -trouble sleeping -unusual bleeding or bruising -unusually weak or tired -vomiting Side effects that usually do not require medical attention (report to your doctor or health care professional if they continue or are bothersome): -change in sex drive or performance -change in appetite or weight -constipation -dizziness -dry mouth -headache -increased sweating -nausea -tired This list may not describe all possible side effects. Call your doctor for medical advice about side effects. You may report side effects to FDA at 1-800-FDA-1088. Where should I keep my medicine? Keep out of the reach of children. Store at a controlled temperature between 20 and 25 degrees C (68 and 77 degrees F), in a dry place. Throw away any unused medicine after the expiration date. NOTE: This sheet is a summary. It may not cover all possible information. If you have questions about this medicine, talk to your doctor, pharmacist, or health care provider.  2018 Elsevier/Gold Standard (2016-05-07 18:42:26)  

## 2018-02-16 NOTE — Progress Notes (Signed)
Patient states she has a little vaginal odor, hot flashes, no sex drive and fatigue, patient states she has been feel s/s of depression ( prefer to stay in bed all day).

## 2018-02-16 NOTE — Addendum Note (Signed)
Addended by: Mariea Clonts D on: 02/16/2018 03:49 PM   Modules accepted: Orders

## 2018-02-16 NOTE — Progress Notes (Signed)
Gynecologic Oncology Interval Visit   Gyn Providers: Dr. Ouida Sills Dr. Leonides Schanz  PCP: Paula Compton, NP   Chief Concern: continued surveillance for grade 1 endometrial cancer  Subjective:  MICAH GALENO is a 62 y.o. female s/p oophorectomy for ovarian cysts in the 1978 who is seen in consultation from Dr. Ouida Sills for grade 1 endometrial cancer. 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   She saw Dr. Leonides Schanz 10/2017 and had a negative exam. She presents today with complaints of fatigue, depression, hot flashes, vaginal discharge, and loss of libido. The hot flashes are bad enough to wake her up at night.   08/18/2017 CA125 6.8  Gynecologic Oncology History LILLITH MCNEFF is a pleasant female s/p oophorectomy for ovarian cysts in the 1978 who is seen in consultation from Dr. Ouida Sills for grade 1 endometrial cancer. See prior notes for complete details.   On 01/06/2017 she underwent robotic TLH, BSO, sentinel node injection, mapping, and bilateral pelvic sentinel lymph node biopsy. She was seen yesterday by Dr. Leonides Schanz for possible prolapse and she discussed pessary placement as well as pelvic PT with the patient. She also complains of indigestion. Her preop CT demonstrated cholelithiasis.   Pathology: DIAGNOSIS:  A. SENTINEL LYMPH NODE, RIGHT EXTERNAL ILIAC VEIN; EXCISION:  - NO TUMOR SEEN IN ONE LYMPH NODE (0/1).   B. PARATUBAL; BIOPSY:  - NO TUMOR SEEN.   C. PELVIC SIDEWALL; BIOPSY:  - NO TUMOR SEEN.   D. SENTINEL LYMPH NODE, LEFT EXTERNAL ILIAC VEIN; EXCISION:  - NO TUMOR SEEN (LYMPH NODE FRAGMENTS).   E. UTERUS WITH CERVIX, BILATERAL FALLOPIAN TUBES AND OVARIES;  HYSTERECTOMY WITH BILATERAL SALPINGO-OOPHORECTOMY:  - NON-INVASIVE ENDOMETRIOID ADENOCARCINOMA, FIGO GRADE I (SEE COMMENT).  - RIGHT OVARY INVOLVED BY LOW GRADE ADENOCARCINOMA (SEE COMMENT).  - NABOTHIAN CYSTS AND FOCAL CHRONIC  CERVICITIS.  - UNREMARKABLE FALLOPIAN TUBES.  - CORTICAL INCLUSION CYSTS AND SIMPLE CYST OF LEFT OVARY.   Cytology:  DIAGNOSIS:  A. PELVIC WASHINGS:  - NEGATIVE FOR MALIGNANCY.  Recut on 2 blocks (A15 is still at [facility]) and did not appreciate any ovarian surface involvement.   [facility] Consultation Pathology Review: "Thank you for sending this diagnostically challenging case for consultation. Five representative slides are received and reviewed. Sections of the uterus demonstrate endometrial adenocarcinoma, FIGO grade 1 endometrioid type, without evidence of myometrial invasion or lymphovascular invasion. Sections of the right ovary demonstrate a neoplastic process with similar architecture and cytomorphology, also felt to be consistent with FIGO grade 1 endometrioid adenocarcinoma. There is no endometriosis noted in the ovary to suggest a synchronous primary. However, the low grade of the endometrial tumor, along with the lack of myometrial invasion or lymphovascular invasion, would argue against metastasis. The fallopian tube demonstrates no evidence of malignancy. Unfortunately in cases such as this, the distinction between metastatic disease and synchronous primaries is not possible. This case was seen in consultation with Drs. Delmer Islam and Kerby Nora, whose opinions are reflected in the above interpretation."  CT scan 01/01/2017 IMPRESSION: 1. Limited study without IV contrast. No intrahepatic biliary ductal dilatation. At least 2 calcified gallstones are noted within contracted gallbladder the largest measures 2 cm. 2. No small bowel or colonic obstruction. 3. No pericecal inflammation.  Normal appendix. 4. No ascites or free abdominal air. 5. Normal size uterus. There is isoechoic nodular filling defect within left fundal endometrial cavity measures 1.8 cm consistent with known history of endometrial malignancy. 6.  No adnexal mass.  No pelvic sidewall adenopathy. 7. Mild  degenerative changes thoracolumbar spine.  CXR 01/04/2017 IMPRESSION: No acute cardiopulmonary disease.  Her case was discussed and the recommendation was for no further therapy for either cancer.     She has been NED since.  Genetic testing  RAD50 gene, VUS, genetic counseling completed. Routine screening recommended.   Other wise [employer] testing negative for APC, ATM, AXIN2, BARD1, BMPR1A, BRCA1, BRCA2, BRIP1, CDH1, CDKN2A (p14ARF), CDKN2A (p16INK4a), CHEK2, CTNNA1, DICER1, EPCAM (deletion/duplication testing only), GREM1 (promoter region deletion/duplication testing only), KIT, MEN1, MLH1, MSH2, MSH3, MSH6, MUTYH, NBN, NF1, PALB2, PDGFRA, PMS2, POLD1, POLE, PTEN, RAD50, RAD51C, RAD51D, SDHB, SDHC, SMAD4, SMARCA4, STK11, TP53, TSC1, TSC2, VHL.   The following genes were evaluated for sequence changes only: HOXB13, NTHL1, SDHA  Tumor testing: Additional uterine pathology: MLH-1: intact nuclear staining (normal phenotype)  MSH-2: intact nuclear staining (normal phenotype)  MSH-6: intact nuclear staining (normal phenotype)  PMS-2: intact nuclear staining (normal phenotype)    Routine health care screening:  Mammogram 01/2017 negative  Colonoscopy 2015   Problem List: Patient Active Problem List   Diagnosis Date Noted  . History of ovarian cancer 08/18/2017  . Gallstones 02/03/2017  . Status post laparoscopic hysterectomy 01/06/2017  . Endometrial cancer ([place]) 12/30/2016  . Menopause 09/16/2015  . Fatigue 09/16/2015  . Stress incontinence 09/16/2015  . 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 BRCA gene mutation negative 11/2017  . Cancer ([place])    skin ca  . Fatigue   . Incontinence    STRESS  . Leg varices   . Ovarian cancer ([place]) 12/2016  . Uterine cancer ([place]) 11/2016    Past Surgical History: Past Surgical History:  Procedure Laterality Date  . ABDOMINAL HYSTERECTOMY  01/06/2017   total  . CHOLECYSTECTOMY N/A  05/28/2017   Procedure: LAPAROSCOPIC CHOLECYSTECTOMY WITH INTRAOPERATIVE CHOLANGIOGRAM;  Surgeon: Robert Bellow, MD;  Location: [facility] ORS;  Service: General;  Laterality: N/A;  . OVARIAN CYST REMOVAL  1978  . ROBOTIC ASSISTED TOTAL HYSTERECTOMY WITH BILATERAL SALPINGO OOPHERECTOMY Bilateral 01/06/2017   Procedure: ROBOTIC ASSISTED TOTAL HYSTERECTOMY WITH BILATERAL SALPINGO OOPHORECTOMY;  Surgeon: Gillis Ends, MD;  Location: [facility] ORS;  Service: Gynecology;  Laterality: Bilateral;  . ROBOTIC PELVIC AND PARA-AORTIC LYMPH NODE DISSECTION N/A 01/06/2017   Procedure: ROBOTIC PELVIC AND PARA-AORTIC LYMPH NODE DISSECTION;  Surgeon: Gillis Ends, MD;  Location: [facility] ORS;  Service: Gynecology;  Laterality: N/A;  . SENTINEL NODE BIOPSY N/A 01/06/2017   Procedure: SENTINEL NODE BIOPSY;  Surgeon: Gillis Ends, MD;  Location: [facility] ORS;  Service: Gynecology;  Laterality: N/A;  . thumb laceration Left 09/2017   Left thumb knuckle    Past Gynecologic History:  Menarche: 12 Last Menstrual Period: Menopause 40's History of Abnormal pap: no, no abnormalities Last pap: 11/2016 [facility].     OB History:  OB History  Gravida Para Term Preterm AB Living  1 1          SAB TAB Ectopic Multiple Live Births               # Outcome Date GA Lbr Len/2nd Weight Sex Delivery Anes PTL Lv  1 Para             Obstetric Comments  Menstrual age: teens    Age 1st Pregnancy: 59    Family History: Family History  Problem Relation Age of Onset  . Heart disease Brother   .  Clotting disorder Brother 58       blood clots associated with inactivity  . Breast cancer Neg Hx     Social History: Social History   Socioeconomic History  . Marital status: Married    Spouse name: Not on file  . Number of children: Not on file  . Years of education: Not on file  . Highest education level: Not on file  Social Needs  . Financial resource strain: Not on file  . Food insecurity - worry: Not  on file  . Food insecurity - inability: Not on file  . Transportation needs - medical: Not on file  . Transportation needs - non-medical: Not on file  Occupational History  . Occupation: Massage therapy  Tobacco Use  . Smoking status: Former Smoker    Packs/day: 1.00    Years: 15.00    Pack years: 15.00    Types: Cigarettes    Last attempt to quit: 01/04/1989    Years since quitting: 29.1  . Smokeless tobacco: Never Used  Substance and Sexual Activity  . Alcohol use: Yes    Alcohol/week: 1.2 oz    Types: 2 Standard drinks or equivalent per week    Comment: occasional  . Drug use: No  . Sexual activity: Yes  Other Topics Concern  . Not on file  Social History Narrative  . Not on file    Allergies: Allergies  Allergen Reactions  . Cantaloupe (Diagnostic) Hives  . Shrimp [Shellfish Allergy] Hives and Nausea And Vomiting    Current Medications: Current Outpatient Medications  Medication Sig Dispense Refill  . fluconazole (DIFLUCAN) 100 MG tablet Take 100 mg by mouth daily.    Marland Kitchen ketoconazole (NIZORAL) 2 % cream Apply 1 application topically 2 (two) times daily as needed for irritation.    . naproxen sodium (ANAPROX) 220 MG tablet Take 220 mg by mouth at bedtime as needed (for pain/tired or sore muscles.).    Marland Kitchen predniSONE (STERAPRED UNI-PAK 21 TAB) 10 MG (21) TBPK tablet Take by mouth daily as needed.    . triamcinolone cream (KENALOG) 0.1 % Apply 1 application topically 2 (two) times daily as needed.     No current facility-administered medications for this visit.     Review of Systems  complaints of fatigue, depression, hot flashes, vaginal discharge, and loss of libido. The hot flashes are bad enough to wake her up at night.  General: fatigue  HEENT: no complaints  Lungs: no complaints  Cardiac: no complaints  GI: no complaints  GU: hot flashes, vaginal discharge, and loss of libido  Musculoskeletal: no complaints  Extremities: no complaints  Skin: as per interval  history  Neuro: no complaints  Endocrine: no complaints  Psych: depression       Objective:  Physical Examination:  BP 131/83 (BP Location: Right Arm, Patient Position: Sitting)   Pulse 82   Temp 98.6 F (37 C) (Tympanic)   Resp 18   Ht 5' 4" (1.626 m)   Wt 222 lb 14.4 oz (101.1 kg)   BMI 38.26 kg/m    ECOG Performance Status: 0 - Asymptomatic  General appearance: alert, cooperative and appears stated age CV: RRR Lungs: BCTA HEENT:PERRLA, extra ocular movement intact and sclera clear, anicteric Lymph node survey: non-palpable inguinal nodes Abdomen: soft, non-tender, without masses or organomegaly, no hernias and well healed incisions Extremities: extremities normal, atraumatic, no cyanosis or edema Neurological exam reveals alert, oriented, normal speech, no focal findings or movement disorder noted.  Pelvic: exam  chaperoned by nurse;  Vulva: normal appearing vulva with no masses, tenderness or lesions; Vagina: normal vagina healed cuff, no lesions, no abnormal discharge; Adnexa: no masses; Uterus/Cervix: surgically absent; Rectal: not indicated   Lab Review n/a  Radiologic Imaging: n/a     Assessment:  CHENITA RUDA is a 62 y.o. 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  MMR testing negative.   RAD50, VUS, carrier. Genetic counseling completed.   Pelvic organ prolapse, mild prolapse  Mutiple complaints including fatigue, depression, hot flashes, and loss of libido.  Medical co-morbidities complicating care: obesity.  Plan:   Problem List Items Addressed This Visit    None      Clinically NED. CA125 today.   RAD50, VUS - genetic counseling completed. Normal screening recommended.  Fatigue symptoms - we will assess with CBC to rule out anemia, TSH to assess for hypothyroidism and CT scan to assess for recurrent disease. Fatigue may also be   Depression - I  urged her to speak with a therapist.   Hot flashes - I offered Effexor therapy, which can also help with depression symptoms. I would only consider ERT if her symptoms were refractory to non-hormonal therapy and her hot flashes were negatively affecting her quality of life.   Loss of libido - I offered referral to the Sexual Health clinic at [facility].  We will continue to alternate visits with Dr. Leonides Schanz for continued surveillance in  3 months. Then she can return to [place] clinic in 6 months.  If NED I have recommended continued close follow up with exams, including pelvic exams, and CA125 levels every 3 months for 2 years, every 4 months for the next year, then every 6 months until 5 years and then annually thereafter. We will order CA125 at her next visit.   Patient education previously provided for obesity, lifestyle, exercise, nutrition, smoking cessation, sexual health, vaginal lubricants. We again discussed her weight and need for weight loss, stressed good nutrition, and exercise.  She was previously offered referral to the CARE program and provided another pamphlet today.   The patient's diagnosis, an outline of the further diagnostic and laboratory studies which will be required, the recommendation, and alternatives were discussed.  All questions were answered to the patient's satisfaction.  A total of 60 minutes were spent with the patient/family today; 50% was spent in education, counseling and coordination of care for endometrial cancer and ovarian cancer.   Gillis Ends, MD    CC:  Dr. Laverta Baltimore   Dr. Vikki Ports Ward  PCPs:  Paula Compton, NP   and  Glean Hess, MD [address] [place], [place] [address] [phone]

## 2018-02-16 NOTE — Progress Notes (Signed)
ffexo

## 2018-02-17 LAB — CA 125: CANCER ANTIGEN (CA) 125: 4.8 U/mL (ref 0.0–38.1)

## 2018-02-18 ENCOUNTER — Telehealth: Payer: Self-pay

## 2018-02-18 NOTE — Telephone Encounter (Signed)
Called and notified Audrey Hart that all of her lab work was WNL. Oncology Nurse Navigator Documentation  Navigator Location: [facility] (02/18/18 1300)   )Navigator Encounter Type: Telephone (02/18/18 1300) Telephone: Outgoing Call;Diagnostic Results (02/18/18 1300)                                                  Time Spent with Patient: 15 (02/18/18 1300)

## 2018-02-23 ENCOUNTER — Other Ambulatory Visit: Payer: Self-pay | Admitting: Obstetrics & Gynecology

## 2018-02-23 DIAGNOSIS — Z1231 Encounter for screening mammogram for malignant neoplasm of breast: Secondary | ICD-10-CM

## 2018-02-25 ENCOUNTER — Ambulatory Visit
Admission: RE | Admit: 2018-02-25 | Discharge: 2018-02-25 | Disposition: A | Discharge status: Home / Self Care | Payer: BLUE CROSS/BLUE SHIELD | Source: Ambulatory Visit | Attending: Obstetrics and Gynecology | Admitting: Obstetrics and Gynecology

## 2018-02-25 DIAGNOSIS — K429 Umbilical hernia without obstruction or gangrene: Secondary | ICD-10-CM | POA: Diagnosis not present

## 2018-02-25 DIAGNOSIS — Z9049 Acquired absence of other specified parts of digestive tract: Secondary | ICD-10-CM | POA: Diagnosis not present

## 2018-02-25 DIAGNOSIS — Z90710 Acquired absence of both cervix and uterus: Secondary | ICD-10-CM | POA: Insufficient documentation

## 2018-02-25 DIAGNOSIS — C541 Malignant neoplasm of endometrium: Secondary | ICD-10-CM | POA: Insufficient documentation

## 2018-02-25 LAB — POCT I-STAT CREATININE: Creatinine, Ser: 0.8 mg/dL (ref 0.44–1.00)

## 2018-02-25 MED ORDER — IOPAMIDOL (ISOVUE-370) INJECTION 76%
80.0000 mL | Freq: Once | INTRAVENOUS | Status: AC | PRN
  Administered 2018-02-25: 80 mL via INTRAVENOUS

## 2018-02-28 ENCOUNTER — Ambulatory Visit
Admission: RE | Admit: 2018-02-28 | Discharge: 2018-02-28 | Disposition: A | Discharge status: Home / Self Care | Payer: BLUE CROSS/BLUE SHIELD | Source: Ambulatory Visit | Attending: Obstetrics & Gynecology | Admitting: Obstetrics & Gynecology

## 2018-02-28 DIAGNOSIS — Z1231 Encounter for screening mammogram for malignant neoplasm of breast: Secondary | ICD-10-CM | POA: Diagnosis present

## 2018-03-01 ENCOUNTER — Telehealth: Payer: Self-pay

## 2018-03-01 NOTE — Telephone Encounter (Signed)
Received missed call back from Ms. Nazir. Her results of CT scan have been released on my chart and she has cleared messages on her phone. CT results left on name identified voicemail.  IMPRESSION: 1. Interval hysterectomy without pelvic recurrence or metastatic disease. 2. Interval cholecystectomy.  No biliary dilatation. 3. Small umbilical hernia containing fat and a small knuckle of small bowel. No evidence of incarceration. 4. Negative chest CT.    Oncology Nurse Navigator Documentation  Navigator Location: [facility] (03/01/18 1300)   )Navigator Encounter Type: Telephone;Diagnostic Results (03/01/18 1300) Telephone: Outgoing Call;Diagnostic Results (03/01/18 1300)                                                  Time Spent with Patient: 15 (03/01/18 1300)

## 2018-03-01 NOTE — Telephone Encounter (Signed)
Called to notify of CT results. Voicemail has not been set up on number listed. Oncology Nurse Navigator Documentation  Navigator Location: [facility] (03/01/18 0900)   )Navigator Encounter Type: Telephone;Diagnostic Results (03/01/18 0900) Telephone: Outgoing Call;Diagnostic Results (03/01/18 0900)                                                  Time Spent with Patient: 15 (03/01/18 0900)

## 2018-05-23 ENCOUNTER — Encounter: Payer: Self-pay | Admitting: Obstetrics and Gynecology

## 2018-05-24 ENCOUNTER — Other Ambulatory Visit: Payer: Self-pay

## 2018-08-17 ENCOUNTER — Ambulatory Visit: Payer: BLUE CROSS/BLUE SHIELD

## 2018-10-14 IMAGING — CT CT ABD-PELV W/O CM
2 of 4 series · 16 of 46 positions shown, 18 images · non-contrast
Comparison: None.

CLINICAL DATA: Pelvic pain, endometrial cancer, scheduled for
hysterectomy 01/06/2017

EXAM:
CT ABDOMEN AND PELVIS WITHOUT CONTRAST
TECHNIQUE: Multidetector CT imaging of the abdomen and pelvis was performed
following the standard protocol without IV contrast.

[Series 2: abd pel wo · axial · 0.72mm/px · z∈[-780,-355]mm · 13 of 95 slices shown, 15 images]
[im 5/95  soft-tissue]
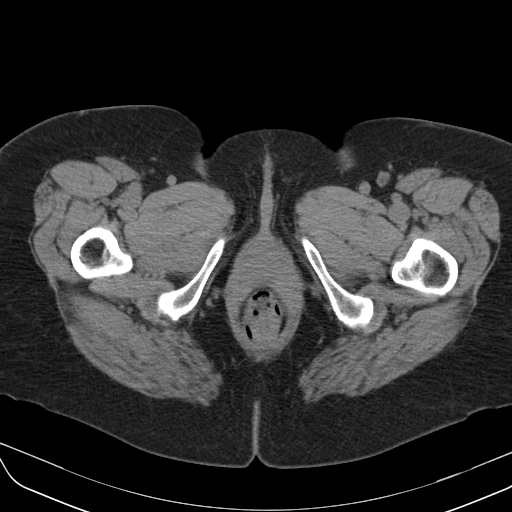
[im 5/95  bone]
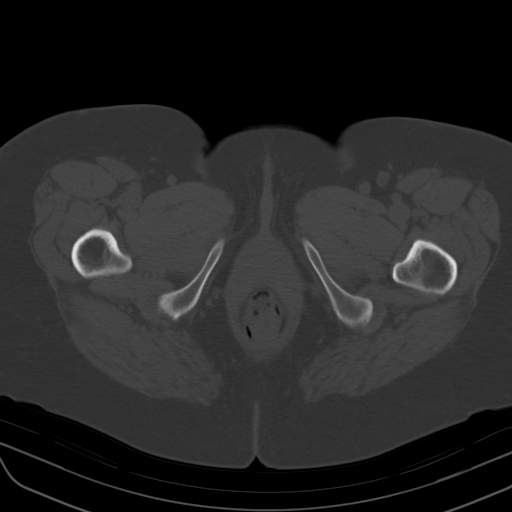
[im 13/95  soft-tissue]
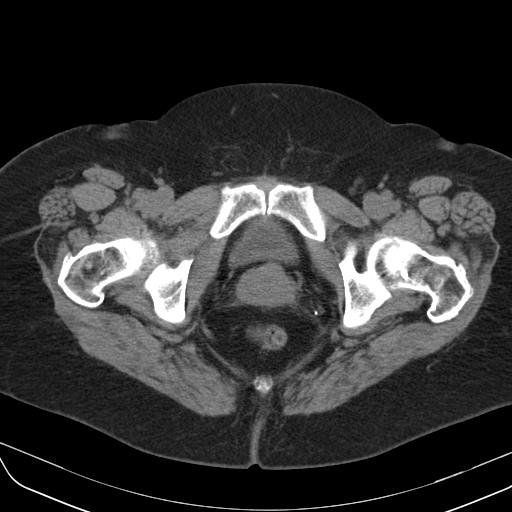
[im 21/95  soft-tissue]
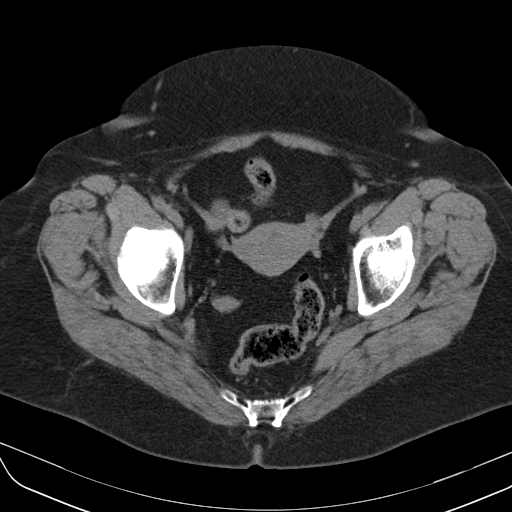
[im 25/95  soft-tissue]
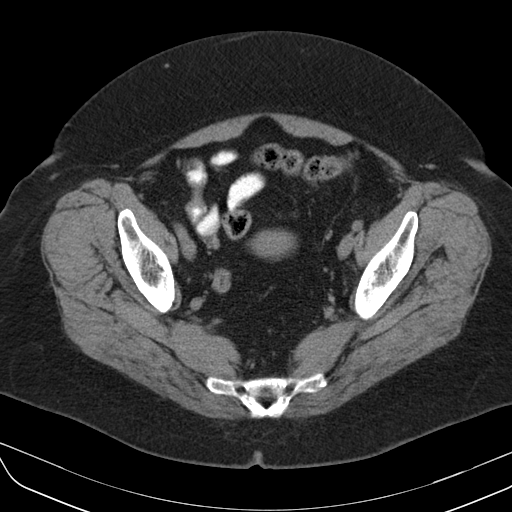
[im 33/95  soft-tissue]
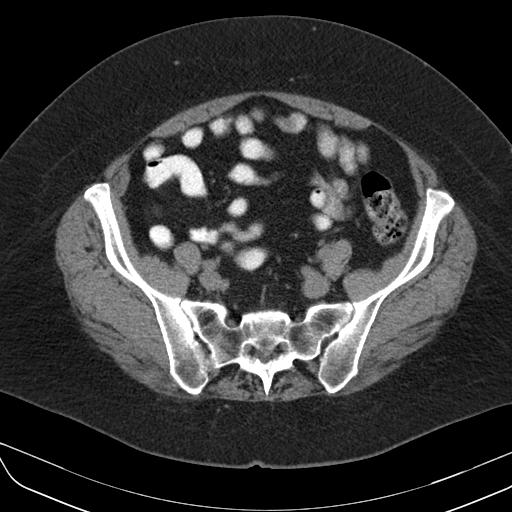
[im 41/95  soft-tissue]
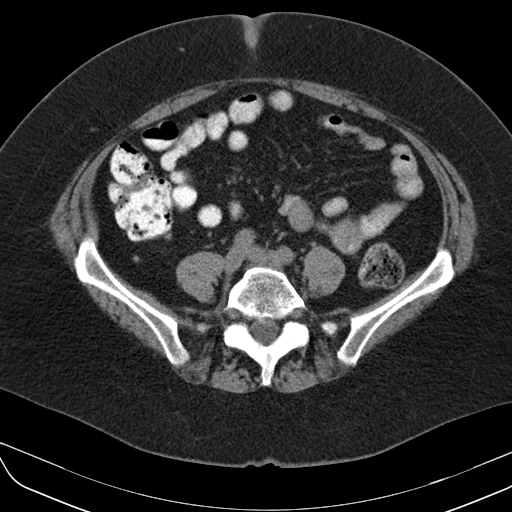
[im 50/95  soft-tissue]
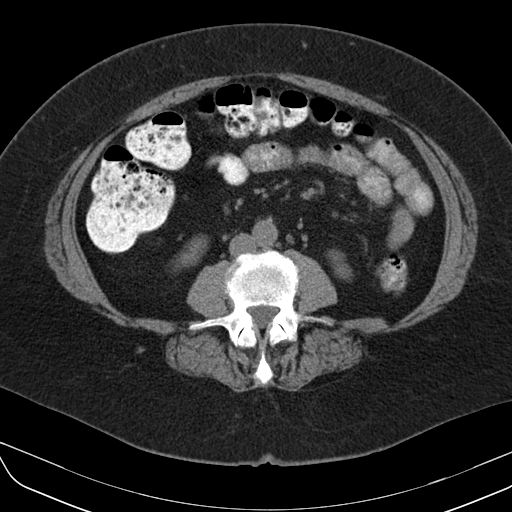
[im 54/95  soft-tissue]
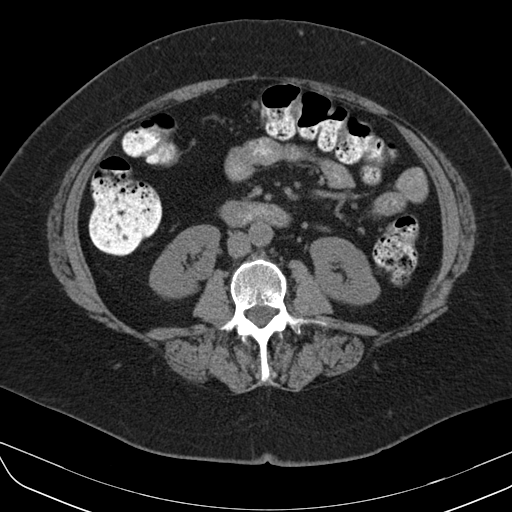
[im 62/95  soft-tissue]
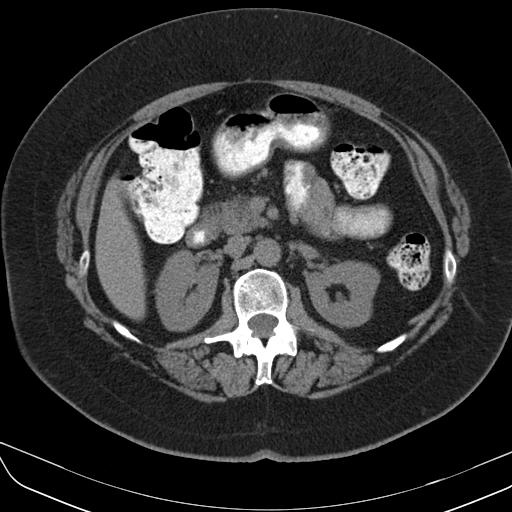
[im 62/95  bone]
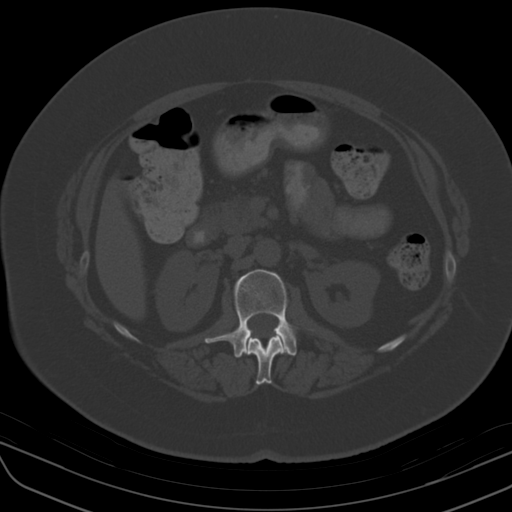
[im 70/95  soft-tissue]
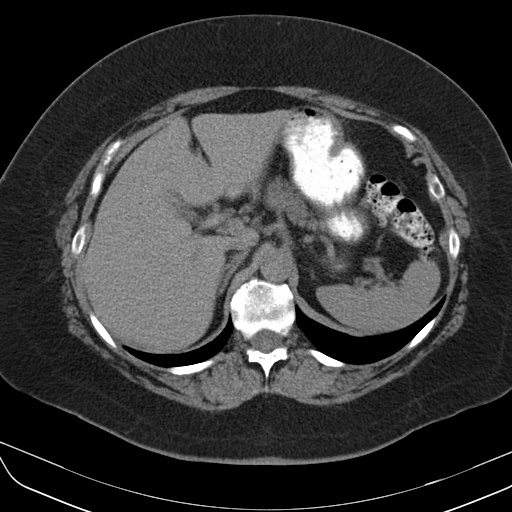
[im 74/95  soft-tissue]
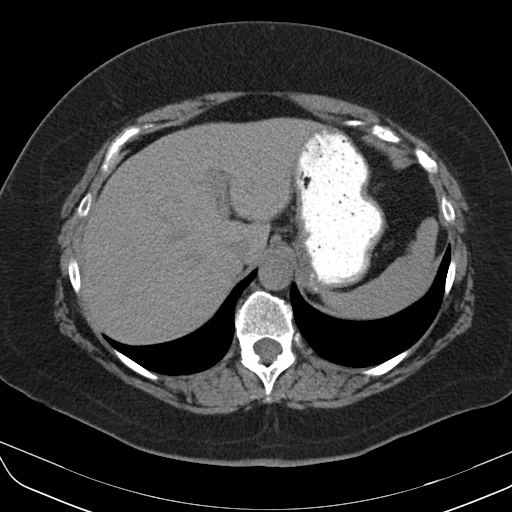
[im 82/95  soft-tissue]
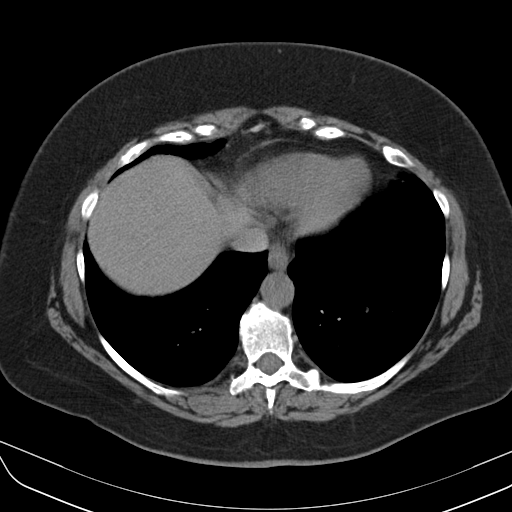
[im 90/95  soft-tissue]
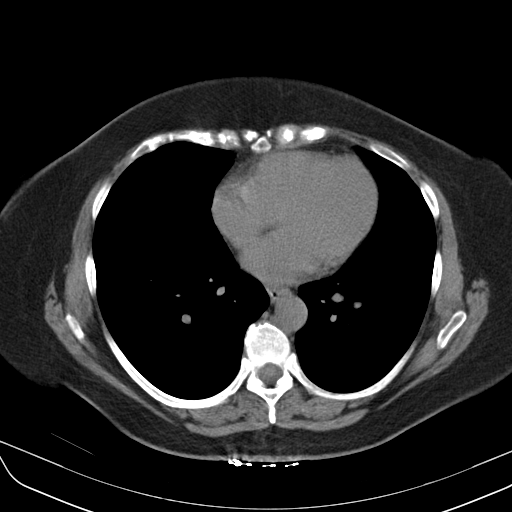

[Series 602: coronal · coronal · 0.94mm/px · 3 of 102 slices shown]
[im 34/102  soft-tissue]
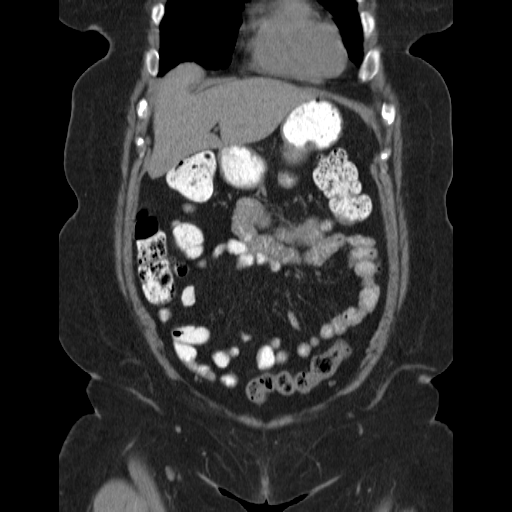
[im 45/102  soft-tissue]
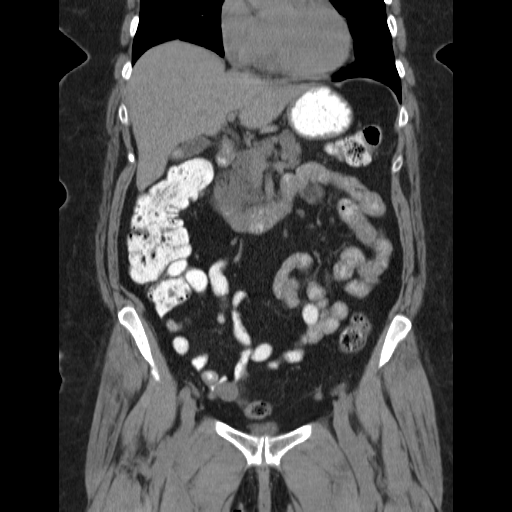
[im 57/102  soft-tissue]
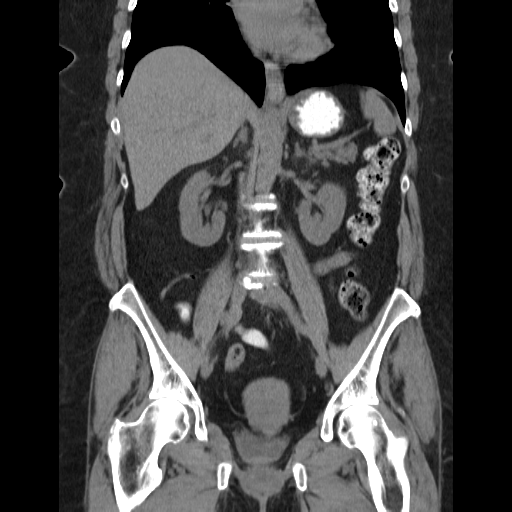

[16 of 46 positions shown; findings below may reference images not displayed]

FINDINGS: Lower chest: Lung bases shows no acute findings.

Hepatobiliary: The study is limited without IV contrast. Unenhanced
liver shows no biliary ductal dilatation. Gallbladder is contracted.
Calcified gallstone in gallbladder fundal region measures 1.7 cm.
Calcified gallstone in gallbladder neck region measures 2 cm. No CBD
dilatation.

Pancreas: Unenhanced pancreas shows no focal abnormality.

Spleen: Unenhanced spleen is normal.

Adrenals/Urinary Tract: No adrenal gland mass. Unenhanced kidneys
shows mild lobulated contour. No nephrolithiasis. No hydronephrosis
or hydroureter. No calcified ureteral calculi are noted. No
calcified calculi are noted within empty urinary bladder.

Stomach/Bowel: Oral contrast material was given to the patient. No
small bowel obstruction. No gastric outlet obstruction. Small hiatal
hernia. No thickened or dilated small bowel loops. The terminal
ileum is unremarkable. Normal retrocecal appendix noted in axial
image 56. Some colonic stool and contrast material noted within
right colon and transverse colon. Some colonic stool noted within
descending colon and sigmoid colon. Mild redundant sigmoid colon. No
colitis or diverticulitis. No distal colonic obstruction.

Vascular/Lymphatic: No aortic aneurysm.  No adenopathy.

Reproductive: The uterus is anteflexed. The uterus measures 7.6 by
5.4 cm. Consistent with history there is heterogeneous isodense
filling defect within left fundal endometrial cavity measures about
1.8 cm probable the known malignancy.

Other: No definite adnexal mass. Visualized unenhanced ovaries are
unremarkable. No pay a PICC sidewall adenopathy. Small nonspecific
bilateral inguinal lymph nodes are noted.

Musculoskeletal: No destructive bony lesions are noted. Sagittal
images of the spine shows degenerative changes with anterior
spurring thoracic and lumbar spine. There is disc space flattening
with mild anterior mild posterior spurring at L5 S1 level.
IMPRESSION: 1. Limited study without IV contrast. No intrahepatic biliary ductal
dilatation. At least 2 calcified gallstones are noted within
contracted gallbladder the largest measures 2 cm.
2. No small bowel or colonic obstruction.
3. No pericecal inflammation.  Normal appendix.
4. No ascites or free abdominal air.
5. Normal size uterus. There is isoechoic nodular filling defect
within left fundal endometrial cavity measures 1.8 cm consistent
with known history of endometrial malignancy.
6. No adnexal mass.  No pelvic sidewall adenopathy.
7. Mild degenerative changes thoracolumbar spine.

## 2019-06-26 ENCOUNTER — Other Ambulatory Visit: Payer: Self-pay | Admitting: Family Medicine

## 2019-06-26 DIAGNOSIS — Z1231 Encounter for screening mammogram for malignant neoplasm of breast: Secondary | ICD-10-CM

## 2019-07-18 ENCOUNTER — Other Ambulatory Visit: Payer: Self-pay

## 2019-07-18 ENCOUNTER — Ambulatory Visit
Admission: RE | Admit: 2019-07-18 | Discharge: 2019-07-18 | Disposition: A | Discharge status: Home / Self Care | Payer: BLUE CROSS/BLUE SHIELD | Source: Ambulatory Visit | Attending: Family Medicine | Admitting: Family Medicine

## 2019-07-18 DIAGNOSIS — Z1231 Encounter for screening mammogram for malignant neoplasm of breast: Secondary | ICD-10-CM

## 2020-06-10 ENCOUNTER — Other Ambulatory Visit: Payer: Self-pay | Admitting: Family Medicine

## 2020-06-13 ENCOUNTER — Other Ambulatory Visit: Payer: Self-pay | Admitting: Family Medicine

## 2020-06-13 DIAGNOSIS — Z1231 Encounter for screening mammogram for malignant neoplasm of breast: Secondary | ICD-10-CM

## 2020-07-24 ENCOUNTER — Ambulatory Visit
Admission: RE | Admit: 2020-07-24 | Discharge: 2020-07-24 | Disposition: A | Discharge status: Home / Self Care | Payer: 59 | Source: Ambulatory Visit | Attending: Family Medicine | Admitting: Family Medicine

## 2020-07-24 ENCOUNTER — Other Ambulatory Visit: Payer: Self-pay

## 2020-07-24 DIAGNOSIS — Z1231 Encounter for screening mammogram for malignant neoplasm of breast: Secondary | ICD-10-CM | POA: Diagnosis present

## 2021-06-10 ENCOUNTER — Other Ambulatory Visit: Payer: Self-pay | Admitting: Family Medicine

## 2021-06-10 DIAGNOSIS — Z1231 Encounter for screening mammogram for malignant neoplasm of breast: Secondary | ICD-10-CM

## 2021-07-29 ENCOUNTER — Ambulatory Visit
Admission: RE | Admit: 2021-07-29 | Discharge: 2021-07-29 | Disposition: A | Discharge status: Home / Self Care | Payer: Medicare HMO | Source: Ambulatory Visit | Attending: Family Medicine | Admitting: Family Medicine

## 2021-07-29 ENCOUNTER — Other Ambulatory Visit: Payer: Self-pay

## 2021-07-29 DIAGNOSIS — Z1231 Encounter for screening mammogram for malignant neoplasm of breast: Secondary | ICD-10-CM | POA: Insufficient documentation

## 2022-07-24 ENCOUNTER — Other Ambulatory Visit: Payer: Self-pay | Admitting: Family Medicine

## 2022-07-24 DIAGNOSIS — Z1231 Encounter for screening mammogram for malignant neoplasm of breast: Secondary | ICD-10-CM

## 2022-09-01 ENCOUNTER — Ambulatory Visit
Admission: RE | Admit: 2022-09-01 | Discharge: 2022-09-01 | Disposition: A | Discharge status: Home / Self Care | Payer: Medicare HMO | Source: Ambulatory Visit | Attending: Family Medicine | Admitting: Family Medicine

## 2022-09-01 DIAGNOSIS — Z1231 Encounter for screening mammogram for malignant neoplasm of breast: Secondary | ICD-10-CM | POA: Diagnosis present

## 2023-05-21 ENCOUNTER — Emergency Department
Admission: EM | Admit: 2023-05-21 | Discharge: 2023-05-21 | Disposition: A | Discharge status: Home / Self Care | Payer: Medicare HMO | Attending: Emergency Medicine | Admitting: Emergency Medicine

## 2023-05-21 ENCOUNTER — Emergency Department: Payer: Medicare HMO

## 2023-05-21 DIAGNOSIS — R319 Hematuria, unspecified: Secondary | ICD-10-CM

## 2023-05-21 DIAGNOSIS — R1010 Upper abdominal pain, unspecified: Secondary | ICD-10-CM

## 2023-05-21 DIAGNOSIS — R140 Abdominal distension (gaseous): Secondary | ICD-10-CM | POA: Diagnosis not present

## 2023-05-21 DIAGNOSIS — Z8543 Personal history of malignant neoplasm of ovary: Secondary | ICD-10-CM | POA: Insufficient documentation

## 2023-05-21 DIAGNOSIS — Z85828 Personal history of other malignant neoplasm of skin: Secondary | ICD-10-CM | POA: Diagnosis not present

## 2023-05-21 DIAGNOSIS — K2900 Acute gastritis without bleeding: Secondary | ICD-10-CM

## 2023-05-21 DIAGNOSIS — Z8542 Personal history of malignant neoplasm of other parts of uterus: Secondary | ICD-10-CM | POA: Insufficient documentation

## 2023-05-21 DIAGNOSIS — R1013 Epigastric pain: Secondary | ICD-10-CM | POA: Insufficient documentation

## 2023-05-21 DIAGNOSIS — R141 Gas pain: Secondary | ICD-10-CM | POA: Diagnosis not present

## 2023-05-21 LAB — COMPREHENSIVE METABOLIC PANEL
ALT: 29 U/L (ref 0–44)
AST: 59 U/L — ABNORMAL HIGH (ref 15–41)
Albumin: 3.5 g/dL (ref 3.5–5.0)
Alkaline Phosphatase: 51 U/L (ref 38–126)
Anion gap: 6 (ref 5–15)
BUN: 18 mg/dL (ref 8–23)
CO2: 26 mmol/L (ref 22–32)
Calcium: 8.2 mg/dL — ABNORMAL LOW (ref 8.9–10.3)
Chloride: 107 mmol/L (ref 98–111)
Creatinine, Ser: 0.78 mg/dL (ref 0.44–1.00)
GFR, Estimated: >60 mL/min (ref >60)
Glucose, Bld: 102 mg/dL — ABNORMAL HIGH (ref 70–99)
Potassium: 4.1 mmol/L (ref 3.5–5.1)
Sodium: 139 mmol/L (ref 135–145)
Total Bilirubin: 1.3 mg/dL — ABNORMAL HIGH (ref 0.3–1.2)
Total Protein: 5.9 g/dL — ABNORMAL LOW (ref 6.5–8.1)

## 2023-05-21 LAB — URINALYSIS, ROUTINE W REFLEX MICROSCOPIC
Bacteria, UA: NONE SEEN
Bilirubin Urine: NEGATIVE
Glucose, UA: NEGATIVE mg/dL
Ketones, ur: NEGATIVE mg/dL
Leukocytes,Ua: NEGATIVE
Nitrite: NEGATIVE
Protein, ur: NEGATIVE mg/dL
Specific Gravity, Urine: 1.021 (ref 1.005–1.030)
pH: 6 (ref 5.0–8.0)

## 2023-05-21 LAB — CBC WITH DIFFERENTIAL/PLATELET
Abs Immature Granulocytes: 0.04 10*3/uL (ref 0.00–0.07)
Basophils Absolute: 0.1 10*3/uL (ref 0.0–0.1)
Basophils Relative: 0 %
Eosinophils Absolute: 0.1 10*3/uL (ref 0.0–0.5)
Eosinophils Relative: 1 %
HCT: 42.4 % (ref 36.0–46.0)
Hemoglobin: 13.7 g/dL (ref 12.0–15.0)
Immature Granulocytes: 0 %
Lymphocytes Relative: 14 %
Lymphs Abs: 1.8 10*3/uL (ref 0.7–4.0)
MCH: 30 pg (ref 26.0–34.0)
MCHC: 32.3 g/dL (ref 30.0–36.0)
MCV: 93 fL (ref 80.0–100.0)
Monocytes Absolute: 0.7 10*3/uL (ref 0.1–1.0)
Monocytes Relative: 6 %
Neutro Abs: 9.5 10*3/uL — ABNORMAL HIGH (ref 1.7–7.7)
Neutrophils Relative %: 79 %
Platelets: 268 10*3/uL (ref 150–400)
RBC: 4.56 MIL/uL (ref 3.87–5.11)
RDW: 13.9 % (ref 11.5–15.5)
WBC: 12.2 10*3/uL — ABNORMAL HIGH (ref 4.0–10.5)
nRBC: 0 % (ref 0.0–0.2)

## 2023-05-21 LAB — TROPONIN I (HIGH SENSITIVITY): Troponin I (High Sensitivity): 5 ng/L (ref <18)

## 2023-05-21 LAB — LIPASE, BLOOD: Lipase: 123 U/L — ABNORMAL HIGH (ref 11–51)

## 2023-05-21 MED ORDER — SODIUM CHLORIDE 0.9 % IV BOLUS
1000.0000 mL | Freq: Once | INTRAVENOUS | Status: AC
  Administered 2023-05-21: 1000 mL via INTRAVENOUS

## 2023-05-21 MED ORDER — CEPHALEXIN 500 MG PO CAPS: 500.0000 mg | ORAL_CAPSULE | Freq: Two times a day (BID) | ORAL | 0 refills | Status: AC

## 2023-05-21 MED ORDER — FAMOTIDINE IN NACL 20-0.9 MG/50ML-% IV SOLN
20.0000 mg | Freq: Once | INTRAVENOUS | Status: AC
  Administered 2023-05-21: 20 mg via INTRAVENOUS
  Filled 2023-05-21: qty 50

## 2023-05-21 MED ORDER — IOHEXOL 300 MG/ML  SOLN
100.0000 mL | Freq: Once | INTRAMUSCULAR | Status: AC | PRN
  Administered 2023-05-21: 100 mL via INTRAVENOUS

## 2023-05-21 NOTE — ED Provider Notes (Signed)
[facility] Provider Note    Event Date/Time   First MD Initiated Contact with Patient 05/21/23 [phone]     (approximate)   History   Abdominal Pain   HPI  Audrey Hart is a 67 y.o. female brought to the ED via EMS from home with a chief complaint of abdominal pain.  Patient was awakened around 3 AM with upper abdominal pain radiating to her back.  Also endorses belching and gas.  Given 25 mcg fentanyl prior to arrival to the emergency department with significant relief of symptoms.  Last week patient was camping.  Noted blood and tissue after wiping after urination.  Has had a full hysterectomy.  Presented to [facility] but left without being seen because she spoke with a gynecologist friend of hers who assured her she was not experiencing an emergency.  Denies associated fever/chills, chest pain, shortness of breath, nausea, vomiting or diarrhea.     Past Medical History   Past Medical History:  Diagnosis Date   BRCA gene mutation negative 11/2017   Cancer (HCC)    skin ca   Fatigue    Incontinence    STRESS   Leg varices    Ovarian cancer (HCC) 12/2016   Uterine cancer (HCC) 11/2016     Active Problem List   Patient Active Problem List   Diagnosis Date Noted   History of ovarian cancer 08/18/2017   Gallstones 02/03/2017   Status post laparoscopic hysterectomy 01/06/2017   Endometrial cancer (HCC) 12/30/2016   Menopause 09/16/2015   Fatigue 09/16/2015   Stress incontinence 09/16/2015   Leg varices 09/16/2015     Past Surgical History   Past Surgical History:  Procedure Laterality Date   ABDOMINAL HYSTERECTOMY  01/06/2017   total   CHOLECYSTECTOMY N/A 05/28/2017   Procedure: LAPAROSCOPIC CHOLECYSTECTOMY WITH INTRAOPERATIVE CHOLANGIOGRAM;  Surgeon: Earline Mayotte, MD;  Location: [facility] ORS;  Service: General;  Laterality: N/A;   OVARIAN CYST REMOVAL  1978   ROBOTIC ASSISTED TOTAL HYSTERECTOMY WITH BILATERAL SALPINGO OOPHERECTOMY  Bilateral 01/06/2017   Procedure: ROBOTIC ASSISTED TOTAL HYSTERECTOMY WITH BILATERAL SALPINGO OOPHORECTOMY;  Surgeon: Artelia Laroche, MD;  Location: [facility] OR[facility];  Service: Gynecology;  Laterality: Bilateral;   ROBOTIC PELVIC AND PARA-AORTIC LYMPH NODE DISSECTION N/A 01/06/2017   Procedure: ROBOTIC PELVIC AND PARA-AORTIC LYMPH NODE DISSECTION;  Surgeon: Artelia Laroche, MD;  Location: [facility] OR[facility];  Service: Gynecology;  Laterality: N/A;   SENTINEL NODE BIOPSY N/A 01/06/2017   Procedure: SENTINEL NODE BIOPSY;  Surgeon: Artelia Laroche, MD;  Location: [facility] ORS;  Service: Gynecology;  Laterality: N/A;   thumb laceration Left 09/2017   Left thumb knuckle     Home Medications   Prior to Admission medications   Medication Sig Start Date End Date Taking? Authorizing Provider  fluconazole (DIFLUCAN) 100 MG tablet Take 100 mg by mouth daily.    [provider]  ketoconazole (NIZORAL) 2 % cream Apply 1 application topically 2 (two) times daily as needed for irritation.    [provider]  naproxen sodium (ANAPROX) 220 MG tablet Take 220 mg by mouth at bedtime as needed (for pain/tired or sore muscles.).    [provider]  predniSONE (STERAPRED UNI-PAK 21 TAB) 10 MG (21) TBPK tablet Take by mouth daily as needed.    [provider]  triamcinolone cream (KENALOG) 0.1 % Apply 1 application topically 2 (two) times daily as needed.    [provider]     Allergies  Cantaloupe (diagnostic) and Shrimp [shellfish allergy]   Family History   Family History  Problem Relation Age of Onset   Heart disease Brother    Clotting disorder Brother 41       blood clots associated with inactivity   Breast cancer Neg Hx      Physical Exam  Triage Vital Signs: ED Triage Vitals  Enc Vitals Group     BP 05/21/23 0542 (!) 140/68     Pulse Rate 05/21/23 0542 61     Resp 05/21/23 0542 18     Temp 05/21/23 0542 (!) 97.5 F (36.4 C)     Temp Source  05/21/23 0542 Oral     SpO2 05/21/23 0542 100 %     Weight 05/21/23 0540 188 lb (85.3 kg)     Height 05/21/23 0540 5\' 3"  (1.6 m)     Head Circumference --      Peak Flow --      Pain Score 05/21/23 0540 4     Pain Loc --      Pain Edu? --      Excl. in GC? --     Updated Vital Signs: BP (!) 140/68   Pulse 61   Temp (!) 97.5 F (36.4 C) (Oral)   Resp 18   Ht 5\' 3"  (1.6 m)   Wt 85.3 kg   SpO2 100%   BMI 33.30 kg/m    General: Awake, mild distress.  CV:  RRR.  Good peripheral perfusion.  Resp:  Normal effort.  CTAB. Abd:  Mild epigastric tenderness to palpation without rebound or guarding.  No distention.  No abdominal bruits. Other:  No truncal vesicles.   ED Results / Procedures / Treatments  Labs (all labs ordered are listed, but only abnormal results are displayed) Labs Reviewed  CBC WITH DIFFERENTIAL/PLATELET  COMPREHENSIVE METABOLIC PANEL  LIPASE, BLOOD  URINALYSIS, ROUTINE W REFLEX MICROSCOPIC  TROPONIN I (HIGH SENSITIVITY)     EKG  ED ECG REPORT I, Alixandra Alfieri J, the attending physician, personally viewed and interpreted this ECG.   Date: 05/21/2023  EKG Time: 0549  Rate: 58  Rhythm: sinus bradycardia  Axis: Normal  Intervals:none  ST&T Change: Nonspecific    RADIOLOGY  CT abdomen/pelvis is pending  Official radiology report(s): No results found.   PROCEDURES:  Critical Care performed: No  .1-3 Lead EKG Interpretation  Performed by: Irean Hong, MD Authorized by: Irean Hong, MD     Interpretation: abnormal     ECG rate:  58   ECG rate assessment: bradycardic     Rhythm: sinus bradycardia     Ectopy: none     Conduction: normal   Comments:     Patient placed on cardiac monitor to evaluate for arrhythmias    MEDICATIONS ORDERED IN ED: Medications  famotidine (PEPCID) IVPB 20 mg premix (20 mg Intravenous New Bag/Given 05/21/23 0644)  sodium chloride 0.9 % bolus 1,000 mL (1,000 mLs Intravenous New Bag/Given 05/21/23 0644)      IMPRESSION / MDM / ASSESSMENT AND PLAN / ED COURSE  I reviewed the triage vital signs and the nursing notes.                             67 year old female presenting with upper abdominal pain. Differential diagnosis includes, but is not limited to, biliary disease (biliary colic, acute cholecystitis, cholangitis, choledocholithiasis, etc), intrathoracic causes for epigastric abdominal pain including ACS, gastritis, duodenitis,  pancreatitis, small bowel or large bowel obstruction, abdominal aortic aneurysm, hernia, and ulcer(s).  I personally reviewed patient's records and note a annual wellness visit to her PCP on 12/09/2022.  Patient's presentation is most consistent with acute presentation with potential threat to life or bodily function.  The patient is on the cardiac monitor to evaluate for evidence of arrhythmia and/or significant heart rate changes.  Will obtain lab work, urine, CT abdomen/pelvis.  Pain has decreased to 3/10 after fentanyl administered by EMS.  Will administer IV fluids, IV Pepcid and reassess.  Clinical Course as of 05/21/23 1610  Fri May 21, 2023  [id number] Care will be transferred to the oncoming provider pending all laboratory and imaging results. [JS]    Clinical Course User Index [JS] Irean Hong, MD     FINAL CLINICAL IMPRESSION(S) / ED DIAGNOSES   Final diagnoses:  Pain of upper abdomen     Rx / DC Orders   ED Discharge Orders     None        Note:  This document was prepared using Dragon voice recognition software and may include unintentional dictation errors.   Irean Hong, MD 05/21/23 [phone]

## 2023-05-21 NOTE — ED Provider Notes (Signed)
67 yo F with upper abdominal pain, woke up at around 3 AM. Radiates to back. Pain 0/10 now after 25 mcg fentanyl. Labs, CT pending. EKG nonischemic.  CT scan unremarkable. UA shows mild hematuria, pt has had urinary urgency/frequency and reported malodor. Will cover in event of atypical UTI and refer to GYN re: her reported prolapse.  Otherwise, pt well appearing, abdominal pain is resolved.  Of note, this occurred about 12 hours after taking a high-dose calcium supplement that she takes once a week on empty stomach.  Suspect she may have mild gastritis or GI distress due to this.  No other apparent emergent pathology is noted on CT.  She is tolerating p.o.  Will discharge with supportive care.   Shaune Pollack, MD 05/21/23 1032

## 2023-05-21 NOTE — ED Triage Notes (Signed)
Pt to ED via EMS from home, pt c/o RLQ that radiates to back and has a tearing feeling. Pt was given Fentanyl by EMS. Pt was recently seen at [facility] for prolapsed uterus. Pt denies urinary symptoms. Pt states she feels nauseous when she is in pain.

## 2023-05-21 NOTE — Discharge Instructions (Signed)
For your stomach discomfort:  Stop the calcium medication  Take Tums as needed for the next 2-3 days  Avoid foods high in acid or spicy food   Take the antibiotic as prescribed  Follow-up with OBGYN in 1 week as we discussed

## 2023-05-24 ENCOUNTER — Other Ambulatory Visit: Payer: Self-pay | Admitting: Physician Assistant

## 2023-05-24 ENCOUNTER — Ambulatory Visit
Admission: RE | Admit: 2023-05-24 | Discharge: 2023-05-24 | Disposition: A | Discharge status: Home / Self Care | Payer: Medicare HMO | Source: Ambulatory Visit | Attending: Physician Assistant | Admitting: Physician Assistant

## 2023-05-24 DIAGNOSIS — R1013 Epigastric pain: Secondary | ICD-10-CM

## 2023-05-24 MED ORDER — GADOBUTROL 1 MMOL/ML IV SOLN
8.0000 mL | Freq: Once | INTRAVENOUS | Status: AC | PRN
  Administered 2023-05-24: 8 mL via INTRAVENOUS

## 2023-07-19 ENCOUNTER — Other Ambulatory Visit: Payer: Self-pay | Admitting: Family Medicine

## 2023-07-19 DIAGNOSIS — Z1231 Encounter for screening mammogram for malignant neoplasm of breast: Secondary | ICD-10-CM

## 2023-09-07 ENCOUNTER — Ambulatory Visit: Payer: Medicare HMO

## 2023-09-14 ENCOUNTER — Ambulatory Visit
Admission: RE | Admit: 2023-09-14 | Discharge: 2023-09-14 | Disposition: A | Discharge status: Home / Self Care | Payer: Medicare HMO | Source: Ambulatory Visit | Attending: Family Medicine | Admitting: Family Medicine

## 2023-09-14 DIAGNOSIS — Z1231 Encounter for screening mammogram for malignant neoplasm of breast: Secondary | ICD-10-CM | POA: Insufficient documentation

## 2023-12-20 ENCOUNTER — Ambulatory Visit
Admission: EM | Admit: 2023-12-20 | Discharge: 2023-12-20 | Disposition: A | Discharge status: Home / Self Care | Payer: Medicare HMO

## 2023-12-20 DIAGNOSIS — M21371 Foot drop, right foot: Secondary | ICD-10-CM

## 2023-12-20 DIAGNOSIS — M5416 Radiculopathy, lumbar region: Secondary | ICD-10-CM

## 2023-12-20 NOTE — Discharge Instructions (Signed)
-  Please go to [facility]. You have an appointment at 4:30 pm today.  [address] [place], [place] [address] Open ? Closes 7:30?PM

## 2023-12-20 NOTE — ED Triage Notes (Addendum)
Sx since Thursday evening.   Patient states that she stubbled over a dog and was up and down lots of steps. Woke up Thursday night in pain with hip pain that radiates down leg. States that the pain has gone away some but the right foot will not move

## 2023-12-20 NOTE — ED Provider Notes (Signed)
[facility]    CSN: [id number] Arrival date & time: 12/20/23  1106      History   Chief Complaint Chief Complaint  Patient presents with   Weakness   foot weakness    HPI Audrey Hart is a 67 y.o. female with history of ovarian and uterine cancer and varicose veins.  Patient presents for weakness of the right foot.  She reports that she cannot dorsiflex the foot at all and has not been able to do so for several days.  She says that she tripped over a dog and that is when she began having right leg pain. Denies fall or trauma. She says she can walk better backwards and dragging her foot. She denies back pain, loss of bowel/bladder control. No history of back issues. Taking Motrin.    HPI  Past Medical History:  Diagnosis Date   BRCA gene mutation negative 11/2017   Cancer (HCC)    skin ca   Fatigue    Incontinence    STRESS   Leg varices    Ovarian cancer (HCC) 12/2016   Uterine cancer (HCC) 11/2016    Patient Active Problem List   Diagnosis Date Noted   History of ovarian cancer 08/18/2017   Gallstones 02/03/2017   Status post laparoscopic hysterectomy 01/06/2017   Endometrial cancer (HCC) 12/30/2016   Menopause 09/16/2015   Fatigue 09/16/2015   Stress incontinence 09/16/2015   Leg varices 09/16/2015    Past Surgical History:  Procedure Laterality Date   ABDOMINAL HYSTERECTOMY  01/06/2017   total   CHOLECYSTECTOMY N/A 05/28/2017   Procedure: LAPAROSCOPIC CHOLECYSTECTOMY WITH INTRAOPERATIVE CHOLANGIOGRAM;  Surgeon: Earline Mayotte, MD;  Location: [facility] OR[facility];  Service: General;  Laterality: N/A;   OVARIAN CYST REMOVAL  1978   ROBOTIC ASSISTED TOTAL HYSTERECTOMY WITH BILATERAL SALPINGO OOPHERECTOMY Bilateral 01/06/2017   Procedure: ROBOTIC ASSISTED TOTAL HYSTERECTOMY WITH BILATERAL SALPINGO OOPHORECTOMY;  Surgeon: Artelia Laroche, MD;  Location: [facility];  Service: Gynecology;  Laterality: Bilateral;   ROBOTIC PELVIC AND PARA-AORTIC LYMPH  NODE DISSECTION N/A 01/06/2017   Procedure: ROBOTIC PELVIC AND PARA-AORTIC LYMPH NODE DISSECTION;  Surgeon: Artelia Laroche, MD;  Location: [facility];  Service: Gynecology;  Laterality: N/A;   SENTINEL NODE BIOPSY N/A 01/06/2017   Procedure: SENTINEL NODE BIOPSY;  Surgeon: Artelia Laroche, MD;  Location: [facility];  Service: Gynecology;  Laterality: N/A;   thumb laceration Left 09/2017   Left thumb knuckle    OB History     Gravida  1   Para  1   Term      Preterm      AB      Living         SAB      IAB      Ectopic      Multiple      Live Births           Obstetric Comments  Menstrual age: teens  Age 1st Pregnancy: 66           Home Medications    Prior to Admission medications   Medication Sig Start Date End Date Taking? Authorizing Provider  fluconazole (DIFLUCAN) 100 MG tablet Take 100 mg by mouth daily.    [provider]  ketoconazole (NIZORAL) 2 % cream Apply 1 application topically 2 (two) times daily as needed for irritation.    [provider]  naproxen sodium (ANAPROX) 220 MG tablet Take 220 mg by mouth at bedtime  as needed (for pain/tired or sore muscles.).    [provider]  predniSONE (STERAPRED UNI-PAK 21 TAB) 10 MG (21) TBPK tablet Take by mouth daily as needed.    [provider]  triamcinolone cream (KENALOG) 0.1 % Apply 1 application topically 2 (two) times daily as needed.    [provider]    Family History Family History  Problem Relation Age of Onset   Heart disease Brother    Clotting disorder Brother 60       blood clots associated with inactivity   Breast cancer Neg Hx     Social History Social History   Tobacco Use   Smoking status: Former    Current packs/day: 0.00    Average packs/day: 1 pack/day for 15.0 years (15.0 ttl pk-yrs)    Types: Cigarettes    Start date: 01/04/1974    Quit date: 01/04/1989    Years since quitting: 34.9   Smokeless tobacco: Never   Vaping Use   Vaping status: Never Used  Substance Use Topics   Alcohol use: Yes    Alcohol/week: 2.0 standard drinks of alcohol    Types: 2 Standard drinks or equivalent per week    Comment: occasional   Drug use: No     Allergies   Cantaloupe (diagnostic), Shrimp [shellfish allergy], Alendronate, and Shrimp extract   Review of Systems Review of Systems  Cardiovascular:  Negative for leg swelling.  Musculoskeletal:  Positive for arthralgias and gait problem. Negative for back pain and joint swelling.  Skin:  Negative for color change and wound.  Neurological:  Positive for weakness and numbness.     Physical Exam Triage Vital Signs ED Triage Vitals  Encounter Vitals Group     BP 12/20/23 1428 119/82     Systolic BP Percentile --      Diastolic BP Percentile --      Pulse Rate 12/20/23 1428 78     Resp 12/20/23 1428 18     Temp 12/20/23 1428 98.9 F (37.2 C)     Temp Source 12/20/23 1428 Oral     SpO2 12/20/23 1428 96 %     Weight --      Height --      Head Circumference --      Peak Flow --      Pain Score 12/20/23 1427 3     Pain Loc --      Pain Education --      Exclude from Growth Chart --    No data found.  Updated Vital Signs BP 119/82 (BP Location: Left Arm)   Pulse 78   Temp 98.9 F (37.2 C) (Oral)   Resp 18   SpO2 96%      Physical Exam Vitals and nursing note reviewed.  Constitutional:      General: She is not in acute distress.    Appearance: Normal appearance. She is not ill-appearing or toxic-appearing.  HENT:     Head: Normocephalic and atraumatic.  Eyes:     General: No scleral icterus.       Right eye: No discharge.        Left eye: No discharge.     Conjunctiva/sclera: Conjunctivae normal.  Cardiovascular:     Rate and Rhythm: Normal rate and regular rhythm.     Heart sounds: Normal heart sounds.  Pulmonary:     Effort: Pulmonary effort is normal. No respiratory distress.     Breath sounds: Normal breath sounds.  Musculoskeletal:     Cervical back: Neck supple.     Comments: Abnormal gait.  No tenderness of back, hip, or leg or foot.  Full range of motion of back.  Discomfort with movement of the right hip.  Positive straight leg raise on the right.  Unable to dorsiflex. Reduced sensation to light touch over the the dorsal and plantar surface of the right foot.   Skin:    General: Skin is dry.  Neurological:     General: No focal deficit present.     Mental Status: She is alert. Mental status is at baseline.     Motor: No weakness.     Gait: Gait abnormal.  Psychiatric:        Mood and Affect: Mood normal.        Behavior: Behavior normal.      UC Treatments / Results  Labs (all labs ordered are listed, but only abnormal results are displayed) Labs Reviewed - No data to display  EKG   Radiology No results found.  Procedures Procedures (including critical care time)  Medications Ordered in UC Medications - No data to display  Initial Impression / Assessment and Plan / UC Course  I have reviewed the triage vital signs and the nursing notes.  Pertinent labs & imaging results that were available during my care of the patient were reviewed by me and considered in my medical decision making (see chart for details).   67 year old female presents for right leg pain, weakness of the right foot and inability to dorsiflex for the past few days.  Symptoms started after she tripped on a dog but did not fall.  No loss of bowel or bladder control.  Has had difficulty walking.  Has been taking Motrin.  On exam patient has no tenderness of back, +SLR on right side and unable to dorsiflex. Reduced sensation to light touch of dorsal right foot and plantar foot.  Explained to patient that her symptoms are consistent with foot drop and lumbar radiculopathy.  Explained that she likely needs an MRI at this time.  Encouraged her to follow-up with orthopedics.  She inquires about assistance with this.  I  contacted the [facility] and made patient an appointment for 4:30 PM at the walk-in orthopedic urgent care.  Information given to patient.  She declines any crutches or a cam boot at this time.  Going over to [facility] in the next hour and a half for her appointment.  Follow-up here as needed.   Final Clinical Impressions(s) / UC Diagnoses   Final diagnoses:  Foot drop, right  Lumbar radiculopathy, acute     Discharge Instructions      -Please go to [facility]. You have an appointment at 4:30 pm today.  [address], [place], [place] [address] Open ? Closes 7:30?PM     ED Prescriptions   None    PDMP not reviewed this encounter.   Shirlee Latch, PA-C 12/20/23 [phone]

## 2023-12-28 ENCOUNTER — Other Ambulatory Visit: Payer: Self-pay | Admitting: Family Medicine

## 2023-12-28 DIAGNOSIS — M21371 Foot drop, right foot: Secondary | ICD-10-CM

## 2023-12-30 ENCOUNTER — Ambulatory Visit
Admission: RE | Admit: 2023-12-30 | Discharge: 2023-12-30 | Disposition: A | Discharge status: Home / Self Care | Payer: Medicare HMO | Source: Ambulatory Visit | Attending: Family Medicine | Admitting: Family Medicine

## 2023-12-30 DIAGNOSIS — M21371 Foot drop, right foot: Secondary | ICD-10-CM | POA: Insufficient documentation

## 2025-01-04 ENCOUNTER — Other Ambulatory Visit: Payer: Self-pay | Admitting: Family Medicine

## 2025-01-04 DIAGNOSIS — Z1231 Encounter for screening mammogram for malignant neoplasm of breast: Secondary | ICD-10-CM

## 2025-02-06 ENCOUNTER — Ambulatory Visit
